# Patient Record
Sex: Female | Born: 1965 | Race: White | Hispanic: No | Marital: Married | Smoking: Never smoker
Health system: Southern US, Community
[De-identification: ages and names within clinical notes are randomized; demographics above are authoritative.]

## PROBLEM LIST (undated history)

## (undated) DIAGNOSIS — T8859XA Other complications of anesthesia, initial encounter: Secondary | ICD-10-CM

## (undated) DIAGNOSIS — R51 Headache: Secondary | ICD-10-CM

## (undated) DIAGNOSIS — K760 Fatty (change of) liver, not elsewhere classified: Secondary | ICD-10-CM

## (undated) DIAGNOSIS — R519 Headache, unspecified: Secondary | ICD-10-CM

## (undated) DIAGNOSIS — M1711 Unilateral primary osteoarthritis, right knee: Secondary | ICD-10-CM

## (undated) DIAGNOSIS — T4145XA Adverse effect of unspecified anesthetic, initial encounter: Secondary | ICD-10-CM

## (undated) DIAGNOSIS — M1990 Unspecified osteoarthritis, unspecified site: Secondary | ICD-10-CM

## (undated) DIAGNOSIS — Z973 Presence of spectacles and contact lenses: Secondary | ICD-10-CM

## (undated) DIAGNOSIS — Z9889 Other specified postprocedural states: Secondary | ICD-10-CM

## (undated) DIAGNOSIS — R112 Nausea with vomiting, unspecified: Secondary | ICD-10-CM

## (undated) DIAGNOSIS — K219 Gastro-esophageal reflux disease without esophagitis: Secondary | ICD-10-CM

## (undated) HISTORY — PX: HERNIA REPAIR: SHX51

## (undated) HISTORY — PX: TUBAL LIGATION: SHX77

## (undated) HISTORY — PX: DILATION AND CURETTAGE OF UTERUS: SHX78

## (undated) HISTORY — PX: UMBILICAL HERNIA REPAIR: SHX196

## (undated) HISTORY — PX: OTHER SURGICAL HISTORY: SHX169

## (undated) HISTORY — PX: ENDOMETRIAL ABLATION: SHX621

## (undated) SURGERY — Surgical Case
Anesthesia: *Unknown

---

## 1985-05-11 HISTORY — PX: KNEE ARTHROSCOPY: SHX127

## 2004-05-12 ENCOUNTER — Emergency Department: Payer: Self-pay | Admitting: General Practice

## 2005-10-01 ENCOUNTER — Emergency Department: Payer: Self-pay | Admitting: General Practice

## 2007-11-23 ENCOUNTER — Ambulatory Visit: Payer: Self-pay | Admitting: Family Medicine

## 2008-03-15 ENCOUNTER — Ambulatory Visit: Payer: Self-pay | Admitting: Obstetrics and Gynecology

## 2008-03-16 ENCOUNTER — Ambulatory Visit: Payer: Self-pay | Admitting: Obstetrics and Gynecology

## 2010-03-21 ENCOUNTER — Ambulatory Visit: Payer: Self-pay | Admitting: Obstetrics and Gynecology

## 2010-03-28 ENCOUNTER — Ambulatory Visit: Payer: Self-pay | Admitting: Obstetrics and Gynecology

## 2010-03-31 LAB — PATHOLOGY REPORT

## 2010-11-06 ENCOUNTER — Ambulatory Visit: Payer: Self-pay | Admitting: Family Medicine

## 2010-11-25 ENCOUNTER — Ambulatory Visit: Payer: Self-pay | Admitting: Internal Medicine

## 2011-03-17 ENCOUNTER — Ambulatory Visit: Payer: Self-pay | Admitting: Obstetrics and Gynecology

## 2011-07-17 ENCOUNTER — Emergency Department: Payer: Self-pay | Admitting: Internal Medicine

## 2011-07-17 LAB — URINALYSIS, COMPLETE
Bacteria: NONE SEEN
Bilirubin,UR: NEGATIVE
Blood: NEGATIVE
Glucose,UR: NEGATIVE mg/dL (ref 0–75)
Ketone: NEGATIVE
Protein: NEGATIVE
RBC,UR: <1 /HPF (ref 0–5)
Specific Gravity: 1.002 (ref 1.003–1.030)
Squamous Epithelial: 1
WBC UR: <1 /HPF (ref 0–5)

## 2011-07-17 LAB — COMPREHENSIVE METABOLIC PANEL
Anion Gap: 7 (ref 7–16)
BUN: 11 mg/dL (ref 7–18)
Calcium, Total: 9.6 mg/dL (ref 8.5–10.1)
Chloride: 105 mmol/L (ref 98–107)
Co2: 29 mmol/L (ref 21–32)
EGFR (African American): >60
EGFR (Non-African Amer.): >60
Glucose: 112 mg/dL — ABNORMAL HIGH (ref 65–99)
Osmolality: 281 (ref 275–301)
Potassium: 3.9 mmol/L (ref 3.5–5.1)
SGOT(AST): 26 U/L (ref 15–37)
SGPT (ALT): 32 U/L
Total Protein: 7.8 g/dL (ref 6.4–8.2)

## 2011-07-17 LAB — TROPONIN I
Troponin-I: <0.02 ng/mL
Troponin-I: <0.02 ng/mL

## 2011-07-17 LAB — CBC
HCT: 37.7 % (ref 35.0–47.0)
MCHC: 33.3 g/dL (ref 32.0–36.0)
MCV: 88 fL (ref 80–100)
RDW: 13.3 % (ref 11.5–14.5)

## 2011-07-17 LAB — CK TOTAL AND CKMB (NOT AT ARMC)
CK, Total: 68 U/L (ref 21–215)
CK-MB: <0.5 ng/mL — ABNORMAL LOW (ref 0.5–3.6)

## 2012-03-30 ENCOUNTER — Ambulatory Visit: Payer: Self-pay | Admitting: Obstetrics and Gynecology

## 2013-04-22 ENCOUNTER — Emergency Department: Payer: Self-pay | Admitting: Emergency Medicine

## 2013-04-25 ENCOUNTER — Ambulatory Visit: Payer: Self-pay | Admitting: Family Medicine

## 2013-04-25 ENCOUNTER — Ambulatory Visit: Payer: Self-pay | Admitting: Obstetrics and Gynecology

## 2013-08-04 ENCOUNTER — Ambulatory Visit: Payer: Self-pay | Admitting: Physician Assistant

## 2013-08-04 LAB — URINALYSIS, COMPLETE
Bacteria: NEGATIVE
Bilirubin,UR: NEGATIVE
Blood: NEGATIVE
Glucose,UR: NEGATIVE mg/dL (ref 0–75)
KETONE: NEGATIVE
LEUKOCYTE ESTERASE: NEGATIVE
Nitrite: NEGATIVE
PH: 6 (ref 4.5–8.0)
PROTEIN: NEGATIVE
SPECIFIC GRAVITY: 1.02 (ref 1.003–1.030)

## 2013-08-04 LAB — CBC WITH DIFFERENTIAL/PLATELET
BASOS PCT: 0.5 %
Basophil #: 0 10*3/uL (ref 0.0–0.1)
EOS ABS: 0.1 10*3/uL (ref 0.0–0.7)
Eosinophil %: 1.7 %
HCT: 37.1 % (ref 35.0–47.0)
HGB: 12.3 g/dL (ref 12.0–16.0)
LYMPHS PCT: 36.7 %
Lymphocyte #: 2.3 10*3/uL (ref 1.0–3.6)
MCH: 29 pg (ref 26.0–34.0)
MCHC: 33.2 g/dL (ref 32.0–36.0)
MCV: 87 fL (ref 80–100)
Monocyte #: 0.6 x10 3/mm (ref 0.2–0.9)
Monocyte %: 9.5 %
Neutrophil #: 3.2 10*3/uL (ref 1.4–6.5)
Neutrophil %: 51.6 %
Platelet: 265 10*3/uL (ref 150–440)
RBC: 4.25 10*6/uL (ref 3.80–5.20)
RDW: 14.2 % (ref 11.5–14.5)
WBC: 6.2 10*3/uL (ref 3.6–11.0)

## 2013-08-04 LAB — BASIC METABOLIC PANEL
Anion Gap: 8 (ref 7–16)
BUN: 20 mg/dL — AB (ref 7–18)
CHLORIDE: 103 mmol/L (ref 98–107)
Calcium, Total: 9.3 mg/dL (ref 8.5–10.1)
Co2: 32 mmol/L (ref 21–32)
Creatinine: 0.85 mg/dL (ref 0.60–1.30)
EGFR (African American): >60
GLUCOSE: 130 mg/dL — AB (ref 65–99)
Osmolality: 289 (ref 275–301)
Potassium: 4.6 mmol/L (ref 3.5–5.1)
Sodium: 143 mmol/L (ref 136–145)

## 2014-05-11 DIAGNOSIS — K5790 Diverticulosis of intestine, part unspecified, without perforation or abscess without bleeding: Secondary | ICD-10-CM

## 2014-05-11 HISTORY — DX: Diverticulosis of intestine, part unspecified, without perforation or abscess without bleeding: K57.90

## 2014-09-25 ENCOUNTER — Other Ambulatory Visit: Payer: Self-pay | Admitting: Obstetrics and Gynecology

## 2014-09-25 DIAGNOSIS — Z1231 Encounter for screening mammogram for malignant neoplasm of breast: Secondary | ICD-10-CM

## 2014-10-03 ENCOUNTER — Ambulatory Visit
Admission: RE | Admit: 2014-10-03 | Discharge: 2014-10-03 | Disposition: A | Discharge status: Home / Self Care | Payer: 59 | Source: Ambulatory Visit | Attending: Obstetrics and Gynecology | Admitting: Obstetrics and Gynecology

## 2014-10-03 DIAGNOSIS — Z1231 Encounter for screening mammogram for malignant neoplasm of breast: Secondary | ICD-10-CM | POA: Insufficient documentation

## 2014-11-14 HISTORY — PX: COLONOSCOPY: SHX174

## 2015-07-09 ENCOUNTER — Other Ambulatory Visit: Payer: Self-pay | Admitting: Orthopedic Surgery

## 2015-07-09 DIAGNOSIS — M25561 Pain in right knee: Secondary | ICD-10-CM

## 2015-07-09 DIAGNOSIS — M2391 Unspecified internal derangement of right knee: Secondary | ICD-10-CM

## 2015-07-12 ENCOUNTER — Ambulatory Visit
Admission: EM | Admit: 2015-07-12 | Discharge: 2015-07-12 | Disposition: A | Discharge status: Home / Self Care | Payer: 59 | Attending: Family Medicine | Admitting: Family Medicine

## 2015-07-12 DIAGNOSIS — H109 Unspecified conjunctivitis: Secondary | ICD-10-CM | POA: Diagnosis not present

## 2015-07-12 MED ORDER — OLOPATADINE HCL 0.1 % OP SOLN
1.0000 [drp] | Freq: Two times a day (BID) | OPHTHALMIC | Status: DC
Start: 2015-07-12 — End: 2016-01-23

## 2015-07-12 MED ORDER — MOXIFLOXACIN HCL 0.5 % OP SOLN: 1.0000 [drp] | Freq: Three times a day (TID) | OPHTHALMIC | Status: DC

## 2015-07-12 NOTE — Discharge Instructions (Signed)
How to Use Eye Drops and Eye Ointments °HOW TO APPLY EYE DROPS °Follow these steps when applying eye drops: °· Wash your hands. °· Tilt your head back. °· Put a finger under your eye and use it to gently pull your lower lid downward. Keep that finger in place. °· Using your other hand, hold the dropper between your thumb and index finger. °· Position the dropper just over the edge of the lower lid. Hold it as close to your eye as you can without touching the dropper to your eye. °· Steady your hand. One way to do this is to lean your index finger against your brow. °· Look up. °· Slowly and gently squeeze one drop of medicine into your eye. °· Close your eye. °· Place a finger between your lower eyelid and your nose. Press gently for 2 minutes. This increases the amount of time that the medicine is exposed to the eye. It also reduces side effects that can develop if the drop gets into the bloodstream through the nose. °HOW TO APPLY EYE OINTMENTS °Follow these steps when applying eye ointments: °· Wash your hands. °· Put a finger under your eye and use it to gently pull your lower lid downward. Keep that finger in place. °· Using your other hand, place the tip of the tube between your thumb and index finger with the remaining fingers braced against your cheek or nose. °· Hold the tube just over the edge of your lower lid without touching the tube to your lid or eyeball. °· Look up. °· Line the inner part of your lower lid with ointment. °· Gently pull up on your upper lid and look down. This will force the ointment to spread over the surface of the eye. °· Release the upper lid. °· If you can, close your eyes for 1-2 minutes. °Do not rub your eyes. If you applied the ointment correctly, your vision will be blurry for a few minutes. This is normal. °ADDITIONAL INFORMATION °· Make sure to use the eye drops or ointment as told by your health care provider. °· If you have been told to use both eye drops and an eye  ointment, apply the eye drops first, then wait 3-4 minutes before you apply the ointment. °· Try not to touch the tip of the dropper or tube to your eye. A dropper or tube that has touched the eye can become contaminated. °  °This information is not intended to replace advice given to you by your health care provider. Make sure you discuss any questions you have with your health care provider. °  °Document Released: 08/03/2000 Document Revised: 09/11/2014 Document Reviewed: 04/23/2014 °Elsevier Interactive Patient Education ©2016 [employer] ° °Bacterial Conjunctivitis °Bacterial conjunctivitis, commonly called pink eye, is an inflammation of the clear membrane that covers the white part of the eye (conjunctiva). The inflammation can also happen on the underside of the eyelids. The blood vessels in the conjunctiva become inflamed, causing the eye to become red or pink. Bacterial conjunctivitis may spread easily from one eye to another and from person to person (contagious).  °CAUSES  °Bacterial conjunctivitis is caused by bacteria. The bacteria may come from your own skin, your upper respiratory tract, or from someone else with bacterial conjunctivitis. °SYMPTOMS  °The normally white color of the eye or the underside of the eyelid is usually pink or red. The pink eye is usually associated with irritation, tearing, and some sensitivity to light. Bacterial conjunctivitis is often   associated with a thick, yellowish discharge from the eye. The discharge may turn into a crust on the eyelids overnight, which causes your eyelids to stick together. If a discharge is present, there may also be some blurred vision in the affected eye. °DIAGNOSIS  °Bacterial conjunctivitis is diagnosed by your caregiver through an eye exam and the symptoms that you report. Your caregiver looks for changes in the surface tissues of your eyes, which may point to the specific type of conjunctivitis. A sample of any discharge may be collected on  a cotton-tip swab if you have a severe case of conjunctivitis, if your cornea is affected, or if you keep getting repeat infections that do not respond to treatment. The sample will be sent to a lab to see if the inflammation is caused by a bacterial infection and to see if the infection will respond to antibiotic medicines. °TREATMENT  °· Bacterial conjunctivitis is treated with antibiotics. Antibiotic eyedrops are most often used. However, antibiotic ointments are also available. Antibiotics pills are sometimes used. Artificial tears or eye washes may ease discomfort. °HOME CARE INSTRUCTIONS  °· To ease discomfort, apply a cool, clean washcloth to your eye for 10-20 minutes, 3-4 times a day. °· Gently wipe away any drainage from your eye with a warm, wet washcloth or a cotton ball. °· Wash your hands often with soap and water. Use paper towels to dry your hands. °· Do not share towels or washcloths. This may spread the infection. °· Change or wash your pillowcase every day. °· You should not use eye makeup until the infection is gone. °· Do not operate machinery or drive if your vision is blurred. °· Stop using contact lenses. Ask your caregiver how to sterilize or replace your contacts before using them again. This depends on the type of contact lenses that you use. °· When applying medicine to the infected eye, do not touch the edge of your eyelid with the eyedrop bottle or ointment tube. °SEEK IMMEDIATE MEDICAL CARE IF:  °· Your infection has not improved within 3 days after beginning treatment. °· You had yellow discharge from your eye and it returns. °· You have increased eye pain. °· Your eye redness is spreading. °· Your vision becomes blurred. °· You have a fever or persistent symptoms for more than 2-3 days. °· You have a fever and your symptoms suddenly get worse. °· You have facial pain, redness, or swelling. °MAKE SURE YOU:  °· Understand these instructions. °· Will watch your condition. °· Will get  help right away if you are not doing well or get worse. °  °This information is not intended to replace advice given to you by your health care provider. Make sure you discuss any questions you have with your health care provider. °  °Document Released: 04/27/2005 Document Revised: 05/18/2014 Document Reviewed: 09/28/2011 °Elsevier Interactive Patient Education ©2016 [employer] ° °

## 2015-07-12 NOTE — ED Notes (Addendum)
Patient c/o left eye pain and was crusted shut this morning.  She states that she has been around people that have pink eye this past Sunday.  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

## 2015-07-12 NOTE — ED Provider Notes (Signed)
CSN: [id number]     Arrival date & time 07/12/15  0900 History   First MD Initiated Contact with Patient 07/12/15 1108     Chief Complaint  Patient presents with  . Conjunctivitis    Left Eye   (Consider location/radiation/quality/duration/timing/severity/associated sxs/prior Treatment) HPI  50 year old female who presents with one-day history of left eye with a watery discharge this morning with a yellow matting of her left eye. He states that she has been around 2 individuals 1 and infant both with confirm bacterial conjunctivitis. His last Sunday 5 days ago. She states that she has no photophobia and had some intermittent form body sensation but that is not present at the present time. She's had no visual disturbances. She does wear contacts but did her period that she will yesterday out and has been using a sub-standard eyeglasses needs a new prescription.  History reviewed. No pertinent past medical history. Past Surgical History  Procedure Laterality Date  . Left knee arthroscopy    . Cesarean section     Family History  Problem Relation Age of Onset  . Cancer Mother    Social History  Substance Use Topics  . Smoking status: Never Smoker   . Smokeless tobacco: Never Used  . Alcohol Use: No   OB History    No data available     Review of Systems  Constitutional: Negative for chills, activity change and fatigue.  Eyes: Positive for pain, discharge and redness. Negative for photophobia and visual disturbance.  All other systems reviewed and are negative.   Allergies  Review of patient's allergies indicates no known allergies.  Home Medications   Prior to Admission medications   Medication Sig Start Date End Date Taking? Authorizing Provider  meloxicam (MOBIC) 7.5 MG tablet Take 7.5 mg by mouth daily.   Yes Historical Provider, MD  moxifloxacin (VIGAMOX) 0.5 % ophthalmic solution Place 1 drop into the left eye 3 (three) times daily. 07/12/15   Lutricia Feil, PA-C   olopatadine (PATANOL) 0.1 % ophthalmic solution Place 1 drop into the left eye 2 (two) times daily. 07/12/15   Lutricia Feil, PA-C   Meds Ordered and Administered this Visit  Medications - No data to display  BP 131/82 mmHg  Pulse 69  Temp(Src) 98 F (36.7 C) (Oral)  Resp 16  Ht  (1.651 m)  Wt 187 lb (84.823 kg)  BMI 31.12 kg/m2  SpO2 100% No data found.   Physical Exam  Constitutional: She appears well-developed and well-nourished. No distress.  HENT:  Head: Normocephalic and atraumatic.  Right Ear: External ear normal.  Left Ear: External ear normal.  Nose: Nose normal.  Mouth/Throat: Oropharynx is clear and moist. No oropharyngeal exudate.  Eyes: EOM are normal. Pupils are equal, round, and reactive to light. Right eye exhibits no discharge. Left eye exhibits discharge.  Examination of the eyes shows PERRLA bilaterally. EOMs are full and intact bilaterally. No irregularity of the pupil is seen. The conjunctiva are mildly injected on the right and more injected on the left although not not intensely erythematous. There is mild yellow discharge seen in the lateral canthus. The eyelid was everted with no findings of any foreign body present. Visual acuity is as is recorded .  Skin: She is not diaphoretic.  Nursing note and vitals reviewed.   ED Course  Procedures (including critical care time)  Labs Review Labs Reviewed - No data to display  Imaging Review No results found.   Visual Acuity  Review  Right Eye Distance: 20/40 (Corrected) Left Eye Distance: 20/30 (Corrected) Bilateral Distance: 20/25 (Corrected)  Right Eye Near:   Left Eye Near:    Bilateral Near:         MDM   1. Conjunctivitis of left eye    Discharge Medication List as of 07/12/2015 11:37 AM    Plan: 1. Diagnosis reviewed with patient 2. rx as per orders; risks, benefits, potential side effects reviewed with patient 3. Recommend supportive treatment with cold compresses as  necessary. She was given a prescription for eyedrops; 1 for mast cell stabilization and the second one for possible  bacterial infection from previous contact with a person with a confirmed conjunctivitis. I've asked her not to wear contacts for the next week she is having treatment. If she is not improved in 2 days I recommended she be seen by an ophthalmologist. 4. F/u prn if symptoms worsen or don't improve     Lutricia FeilWilliam P Lin Hackmann, PA-C 07/12/15 1148

## 2015-07-30 ENCOUNTER — Ambulatory Visit
Admission: RE | Admit: 2015-07-30 | Discharge: 2015-07-30 | Disposition: A | Discharge status: Home / Self Care | Payer: 59 | Source: Ambulatory Visit | Attending: Orthopedic Surgery | Admitting: Orthopedic Surgery

## 2015-07-30 DIAGNOSIS — M25561 Pain in right knee: Secondary | ICD-10-CM | POA: Insufficient documentation

## 2015-07-30 DIAGNOSIS — M2241 Chondromalacia patellae, right knee: Secondary | ICD-10-CM | POA: Insufficient documentation

## 2015-07-30 DIAGNOSIS — M2391 Unspecified internal derangement of right knee: Secondary | ICD-10-CM

## 2015-10-01 ENCOUNTER — Other Ambulatory Visit: Payer: Self-pay | Admitting: Obstetrics and Gynecology

## 2015-10-01 DIAGNOSIS — R748 Abnormal levels of other serum enzymes: Secondary | ICD-10-CM

## 2015-10-01 DIAGNOSIS — M1711 Unilateral primary osteoarthritis, right knee: Secondary | ICD-10-CM | POA: Insufficient documentation

## 2015-10-01 DIAGNOSIS — Z1231 Encounter for screening mammogram for malignant neoplasm of breast: Secondary | ICD-10-CM

## 2015-10-09 ENCOUNTER — Ambulatory Visit: Admission: RE | Admit: 2015-10-09 | Payer: 59 | Source: Ambulatory Visit

## 2015-10-14 ENCOUNTER — Ambulatory Visit: Admission: RE | Admit: 2015-10-14 | Payer: 59 | Source: Ambulatory Visit

## 2015-10-21 ENCOUNTER — Ambulatory Visit
Admission: RE | Admit: 2015-10-21 | Discharge: 2015-10-21 | Disposition: A | Discharge status: Home / Self Care | Payer: 59 | Source: Ambulatory Visit | Attending: Obstetrics and Gynecology | Admitting: Obstetrics and Gynecology

## 2015-10-21 DIAGNOSIS — R928 Other abnormal and inconclusive findings on diagnostic imaging of breast: Secondary | ICD-10-CM | POA: Diagnosis not present

## 2015-10-21 DIAGNOSIS — Z1231 Encounter for screening mammogram for malignant neoplasm of breast: Secondary | ICD-10-CM | POA: Diagnosis not present

## 2015-10-23 ENCOUNTER — Other Ambulatory Visit: Payer: Self-pay | Admitting: Obstetrics and Gynecology

## 2015-10-23 DIAGNOSIS — R928 Other abnormal and inconclusive findings on diagnostic imaging of breast: Secondary | ICD-10-CM

## 2015-10-28 ENCOUNTER — Ambulatory Visit
Admission: RE | Admit: 2015-10-28 | Discharge: 2015-10-28 | Disposition: A | Discharge status: Home / Self Care | Payer: 59 | Source: Ambulatory Visit | Attending: Obstetrics and Gynecology | Admitting: Obstetrics and Gynecology

## 2015-10-28 DIAGNOSIS — R928 Other abnormal and inconclusive findings on diagnostic imaging of breast: Secondary | ICD-10-CM

## 2015-11-14 ENCOUNTER — Ambulatory Visit: Payer: Self-pay | Admitting: Family Medicine

## 2015-11-27 ENCOUNTER — Ambulatory Visit: Payer: Self-pay | Admitting: Family Medicine

## 2015-11-28 ENCOUNTER — Ambulatory Visit: Payer: Self-pay | Admitting: Family Medicine

## 2015-11-29 ENCOUNTER — Ambulatory Visit: Payer: Self-pay | Admitting: Family Medicine

## 2015-12-11 ENCOUNTER — Ambulatory Visit (INDEPENDENT_AMBULATORY_CARE_PROVIDER_SITE_OTHER): Payer: 59 | Admitting: Family Medicine

## 2015-12-11 ENCOUNTER — Encounter: Payer: Self-pay | Admitting: Family Medicine

## 2015-12-11 VITALS — BP 100/70 | HR 70 | Ht 65.0 in | Wt 193.0 lb

## 2015-12-11 DIAGNOSIS — R945 Abnormal results of liver function studies: Principal | ICD-10-CM

## 2015-12-11 DIAGNOSIS — R7989 Other specified abnormal findings of blood chemistry: Secondary | ICD-10-CM | POA: Diagnosis not present

## 2015-12-11 NOTE — Progress Notes (Signed)
Name: Tamara [name]   MRN: [id number]    DOB: 1965-11-01   Date:12/11/2015       Progress Note  Subjective  Chief Complaint  Chief Complaint  Patient presents with  . Follow-up    elevated ALT enzyme on labs from 10/18/15    Patient presents with mild elevation of liver function test.    No problem-specific Assessment & Plan notes found for this encounter.   No past medical history on file.  Past Surgical History:  Procedure Laterality Date  . CESAREAN SECTION    . Left Knee Arthroscopy      Family History  Problem Relation Age of Onset  . Cancer Mother   . Breast cancer Neg Hx     Social History   Social History  . Marital status: Married    Spouse name: N/A  . Number of children: N/A  . Years of education: N/A   Occupational History  . Not on file.   Social History Main Topics  . Smoking status: Never Smoker  . Smokeless tobacco: Never Used  . Alcohol use No  . Drug use: No  . Sexual activity: Not on file   Other Topics Concern  . Not on file   Social History Narrative  . No narrative on file    No Known Allergies   Review of Systems  Constitutional: Negative for chills, fever, malaise/fatigue and weight loss.  HENT: Negative for ear discharge, ear pain and sore throat.   Eyes: Negative for blurred vision.  Respiratory: Negative for cough, sputum production, shortness of breath and wheezing.   Cardiovascular: Negative for chest pain, palpitations and leg swelling.  Gastrointestinal: Negative for abdominal pain, blood in stool, constipation, diarrhea, heartburn, melena and nausea.  Genitourinary: Negative for dysuria, frequency, hematuria and urgency.  Musculoskeletal: Negative for back pain, joint pain, myalgias and neck pain.  Skin: Negative for rash.  Neurological: Negative for dizziness, tingling, sensory change, focal weakness and headaches.  Endo/Heme/Allergies: Negative for environmental allergies and polydipsia. Does not bruise/bleed easily.   Psychiatric/Behavioral: Negative for depression and suicidal ideas. The patient is not nervous/anxious and does not have insomnia.      Objective  Vitals:   12/11/15 1132  BP: 100/70  Pulse: 70  Weight: 193 lb (87.5 kg)  Height: 5\' 5"  (1.651 m)    Physical Exam  Constitutional: She is well-developed, well-nourished, and in no distress. No distress.  HENT:  Head: Normocephalic and atraumatic.  Right Ear: External ear normal.  Left Ear: External ear normal.  Nose: Nose normal.  Mouth/Throat: Oropharynx is clear and moist.  Eyes: Conjunctivae and EOM are normal. Pupils are equal, round, and reactive to light. Right eye exhibits no discharge. Left eye exhibits no discharge.  Neck: Normal range of motion. Neck supple. No JVD present. No thyromegaly present.  Cardiovascular: Normal rate, regular rhythm, normal heart sounds and intact distal pulses.  Exam reveals no gallop and no friction rub.   No murmur heard. Pulmonary/Chest: Effort normal and breath sounds normal. She has no wheezes. She has no rales.  Abdominal: Soft. Bowel sounds are normal. She exhibits no mass. There is no hepatosplenomegaly. There is no tenderness. There is no guarding and no CVA tenderness.  Musculoskeletal: Normal range of motion. She exhibits no edema.  Lymphadenopathy:    She has no cervical adenopathy.  Neurological: She is alert. She has normal reflexes.  Skin: Skin is warm and dry. She is not diaphoretic.  Psychiatric: Mood and affect normal.  Nursing note and vitals reviewed.     Assessment & Plan  Problem List Items Addressed This Visit    None    Visit Diagnoses    Elevated liver function tests    -  Primary   Relevant Orders   Hepatic function panel        Dr. Elizabeth Sauer [facility]  12/11/15

## 2015-12-12 ENCOUNTER — Other Ambulatory Visit: Payer: Self-pay

## 2015-12-12 LAB — HEPATIC FUNCTION PANEL
ALBUMIN: 4.3 g/dL (ref 3.5–5.5)
ALK PHOS: 74 IU/L (ref 39–117)
ALT: 75 IU/L — ABNORMAL HIGH (ref 0–32)
AST: 41 IU/L — AB (ref 0–40)
BILIRUBIN TOTAL: 0.2 mg/dL (ref 0.0–1.2)
Bilirubin, Direct: 0.08 mg/dL (ref 0.00–0.40)
Total Protein: 7.3 g/dL (ref 6.0–8.5)

## 2015-12-17 ENCOUNTER — Ambulatory Visit
Admission: RE | Admit: 2015-12-17 | Discharge: 2015-12-17 | Disposition: A | Discharge status: Home / Self Care | Payer: 59 | Source: Ambulatory Visit | Attending: Family Medicine | Admitting: Family Medicine

## 2015-12-17 ENCOUNTER — Other Ambulatory Visit: Payer: Self-pay

## 2015-12-17 DIAGNOSIS — R748 Abnormal levels of other serum enzymes: Secondary | ICD-10-CM | POA: Diagnosis present

## 2015-12-17 DIAGNOSIS — R932 Abnormal findings on diagnostic imaging of liver and biliary tract: Secondary | ICD-10-CM | POA: Insufficient documentation

## 2015-12-17 NOTE — Patient Instructions (Addendum)

## 2015-12-18 ENCOUNTER — Ambulatory Visit: Payer: 59

## 2016-01-08 ENCOUNTER — Other Ambulatory Visit: Payer: Self-pay

## 2016-01-21 ENCOUNTER — Ambulatory Visit: Payer: 59 | Admitting: Family Medicine

## 2016-01-23 ENCOUNTER — Ambulatory Visit (INDEPENDENT_AMBULATORY_CARE_PROVIDER_SITE_OTHER): Payer: 59 | Admitting: Family Medicine

## 2016-01-23 ENCOUNTER — Encounter: Payer: Self-pay | Admitting: Family Medicine

## 2016-01-23 VITALS — BP 120/88 | HR 80 | Ht 65.0 in | Wt 196.0 lb

## 2016-01-23 DIAGNOSIS — R899 Unspecified abnormal finding in specimens from other organs, systems and tissues: Secondary | ICD-10-CM | POA: Diagnosis not present

## 2016-01-23 DIAGNOSIS — R635 Abnormal weight gain: Secondary | ICD-10-CM

## 2016-01-23 DIAGNOSIS — E8889 Other specified metabolic disorders: Secondary | ICD-10-CM

## 2016-01-23 DIAGNOSIS — E669 Obesity, unspecified: Secondary | ICD-10-CM

## 2016-01-23 DIAGNOSIS — E7889 Other lipoprotein metabolism disorders: Secondary | ICD-10-CM | POA: Diagnosis not present

## 2016-01-23 NOTE — Progress Notes (Signed)
Name: Tamara [name]   MRN: [id number]    DOB: 07/22/1965   Date:01/23/2016       Progress Note  Subjective  Chief Complaint  Chief Complaint  Patient presents with  . Follow-up    lab/ liver enzymes    PATIENT PRESENTS FOR FOLLOWUP FOR ELEVATED LIVER ENYMES.    Other  This is a recurrent (elevation of LFT's.) problem. The current episode started more than 1 month ago. The problem occurs intermittently. The problem has been waxing and waning. Pertinent negatives include no abdominal pain, anorexia, arthralgias, change in bowel habit, chest pain, chills, congestion, coughing, diaphoresis, fatigue, fever, headaches, joint swelling, myalgias, nausea, neck pain, numbness, rash, sore throat, swollen glands, urinary symptoms, vertigo, visual change, vomiting or weakness. Nothing aggravates the symptoms. Treatments tried: low lipid diet. The treatment provided mild relief.    No problem-specific Assessment & Plan notes found for this encounter.   History reviewed. No pertinent past medical history.  Past Surgical History:  Procedure Laterality Date  . CESAREAN SECTION    . Left Knee Arthroscopy      Family History  Problem Relation Age of Onset  . Cancer Mother   . Breast cancer Neg Hx     Social History   Social History  . Marital status: Married    Spouse name: N/A  . Number of children: N/A  . Years of education: N/A   Occupational History  . Not on file.   Social History Main Topics  . Smoking status: Never Smoker  . Smokeless tobacco: Never Used  . Alcohol use No  . Drug use: No  . Sexual activity: Not on file   Other Topics Concern  . Not on file   Social History Narrative  . No narrative on file    No Known Allergies   Review of Systems  Constitutional: Negative for chills, diaphoresis, fatigue, fever, malaise/fatigue and weight loss.  HENT: Negative for congestion, ear discharge, ear pain and sore throat.   Eyes: Negative for blurred vision.   Respiratory: Negative for cough, sputum production, shortness of breath and wheezing.   Cardiovascular: Negative for chest pain, palpitations and leg swelling.  Gastrointestinal: Negative for abdominal pain, anorexia, blood in stool, change in bowel habit, constipation, diarrhea, heartburn, melena, nausea and vomiting.  Genitourinary: Negative for dysuria, frequency, hematuria and urgency.  Musculoskeletal: Negative for arthralgias, back pain, joint pain, joint swelling, myalgias and neck pain.  Skin: Negative for rash.  Neurological: Negative for dizziness, vertigo, tingling, sensory change, focal weakness, weakness, numbness and headaches.  Endo/Heme/Allergies: Negative for environmental allergies and polydipsia. Does not bruise/bleed easily.  Psychiatric/Behavioral: Negative for depression and suicidal ideas. The patient is not nervous/anxious and does not have insomnia.      Objective  Vitals:   01/23/16 0922  BP: 120/88  Pulse: 80  Weight: 196 lb (88.9 kg)  Height: 5\' 5"  (1.651 m)    Physical Exam    Assessment & Plan  Problem List Items Addressed This Visit    None    Visit Diagnoses    Abnormal laboratory test    -  Primary   Relevant Orders   Hepatic function panel   Steatosis       Relevant Orders   Hepatic function panel   Obesity       suggest weight loss   Relevant Orders   Thyroid Panel With TSH   Weight gain, abnormal       Relevant Orders   Thyroid  Panel With TSH        Dr. [facility]reanna Lev[facility]  01/23/16

## 2016-01-23 NOTE — Patient Instructions (Signed)
Why follow it? Research shows. . Those who follow the Mediterranean diet have a reduced risk of heart disease  . The diet is associated with a reduced incidence of Parkinson's and Alzheimer's diseases . People following the diet may have longer life expectancies and lower rates of chronic diseases  . The Dietary Guidelines for Americans recommends the Mediterranean diet as an eating plan to promote health and prevent disease  What Is the Mediterranean Diet?  . Healthy eating plan based on typical foods and recipes of Mediterranean-style cooking . The diet is primarily a plant based diet; these foods should make up a majority of meals   Starches - Plant based foods should make up a majority of meals - They are an important sources of vitamins, minerals, energy, antioxidants, and fiber - Choose whole grains, foods high in fiber and minimally processed items  - Typical grain sources include wheat, oats, barley, corn, brown rice, bulgar, farro, millet, polenta, couscous  - Various types of beans include chickpeas, lentils, fava beans, black beans, white beans   Fruits  Veggies - Large quantities of antioxidant rich fruits & veggies; 6 or more servings  - Vegetables can be eaten raw or lightly drizzled with oil and cooked  - Vegetables common to the traditional Mediterranean Diet include: artichokes, arugula, beets, broccoli, brussel sprouts, cabbage, carrots, celery, collard greens, cucumbers, eggplant, kale, leeks, lemons, lettuce, mushrooms, okra, onions, peas, peppers, potatoes, pumpkin, radishes, rutabaga, shallots, spinach, sweet potatoes, turnips, zucchini - Fruits common to the Mediterranean Diet include: apples, apricots, avocados, cherries, clementines, dates, figs, grapefruits, grapes, melons, nectarines, oranges, peaches, pears, pomegranates, strawberries, tangerines  Fats - Replace butter and margarine with healthy oils, such as olive oil, canola oil, and tahini  - Limit nuts to no  more than a handful a day  - Nuts include walnuts, almonds, pecans, pistachios, pine nuts  - Limit or avoid candied, honey roasted or heavily salted nuts - Olives are central to the Mediterranean diet - can be eaten whole or used in a variety of dishes   Meats Protein - Limiting red meat: no more than a few times a month - When eating red meat: choose lean cuts and keep the portion to the size of deck of cards - Eggs: approx. 0 to 4 times a week  - Fish and lean poultry: at least 2 a week  - Healthy protein sources include, chicken, turkey, lean beef, lamb - Increase intake of seafood such as tuna, salmon, trout, mackerel, shrimp, scallops - Avoid or limit high fat processed meats such as sausage and bacon  Dairy - Include moderate amounts of low fat dairy products  - Focus on healthy dairy such as fat free yogurt, skim milk, low or reduced fat cheese - Limit dairy products higher in fat such as whole or 2% milk, cheese, ice cream  Alcohol - Moderate amounts of red wine is ok  - No more than 5 oz daily for women (all ages) and men older than age 65  - No more than 10 oz of wine daily for men younger than 65  Other - Limit sweets and other desserts  - Use herbs and spices instead of salt to flavor foods  - Herbs and spices common to the traditional Mediterranean Diet include: basil, bay leaves, chives, cloves, cumin, fennel, garlic, lavender, marjoram, mint, oregano, parsley, pepper, rosemary, sage, savory, sumac, tarragon, thyme   It's not just a diet, it's a lifestyle:  . The Mediterranean diet includes   lifestyle factors typical of those in the region  . Foods, drinks and meals are best eaten with others and savored . Daily physical activity is important for overall good health . This could be strenuous exercise like running and aerobics . This could also be more leisurely activities such as walking, housework, yard-work, or taking the stairs . Moderation is the key; a balanced and  healthy diet accommodates most foods and drinks . Consider portion sizes and frequency of consumption of certain foods   Meal Ideas & Options:  . Breakfast:  o Whole wheat toast or whole wheat English muffins with peanut butter & hard boiled egg o Steel cut oats topped with apples & cinnamon and skim milk  o Fresh fruit: banana, strawberries, melon, berries, peaches  o Smoothies: strawberries, bananas, greek yogurt, peanut butter o Low fat greek yogurt with blueberries and granola  o Egg white omelet with spinach and mushrooms o Breakfast couscous: whole wheat couscous, apricots, skim milk, cranberries  . Sandwiches:  o Hummus and grilled vegetables (peppers, zucchini, squash) on whole wheat bread   o Grilled chicken on whole wheat pita with lettuce, tomatoes, cucumbers or tzatziki  o Tuna salad on whole wheat bread: tuna salad made with greek yogurt, olives, red peppers, capers, green onions o Garlic rosemary lamb pita: lamb sauted with garlic, rosemary, salt & pepper; add lettuce, cucumber, greek yogurt to pita - flavor with lemon juice and black pepper  . Seafood:  o Mediterranean grilled salmon, seasoned with garlic, basil, parsley, lemon juice and black pepper o Shrimp, lemon, and spinach whole-grain pasta salad made with low fat greek yogurt  o Seared scallops with lemon orzo  o Seared tuna steaks seasoned salt, pepper, coriander topped with tomato mixture of olives, tomatoes, olive oil, minced garlic, parsley, green onions and cappers  . Meats:  o Herbed greek chicken salad with kalamata olives, cucumber, feta  o Red bell peppers stuffed with spinach, bulgur, lean ground beef (or lentils) & topped with feta   o Kebabs: skewers of chicken, tomatoes, onions, zucchini, squash  o [place] burgers: made with red onions, mint, dill, lemon juice, feta cheese topped with roasted red peppers . Vegetarian o Cucumber salad: cucumbers, artichoke hearts, celery, red onion, feta cheese, tossed in  olive oil & lemon juice  o Hummus and whole grain pita points with a greek salad (lettuce, tomato, feta, olives, cucumbers, red onion) o Lentil soup with celery, carrots made with vegetable broth, garlic, salt and pepper  o Tabouli salad: parsley, bulgur, mint, scallions, cucumbers, tomato, radishes, lemon juice, olive oil, salt and pepper.     Calorie Counting for Weight Loss Calories are energy you get from the things you eat and drink. Your body uses this energy to keep you going throughout the day. The number of calories you eat affects your weight. When you eat more calories than your body needs, your body stores the extra calories as fat. When you eat fewer calories than your body needs, your body burns fat to get the energy it needs. Calorie counting means keeping track of how many calories you eat and drink each day. If you make sure to eat fewer calories than your body needs, you should lose weight. In order for calorie counting to work, you will need to eat the number of calories that are right for you in a day to lose a healthy amount of weight per week. A healthy amount of weight to lose per week is usually 1-2  lb (0.5-0.9 kg). A dietitian can determine how many calories you need in a day and give you suggestions on how to reach your calorie goal.  WHAT IS MY MY PLAN? My goal is to have __________ calories per day.  If I have this many calories per day, I should lose around __________ pounds per week. WHAT DO I NEED TO KNOW ABOUT CALORIE COUNTING? In order to meet your daily calorie goal, you will need to:  Find out how many calories are in each food you would like to eat. Try to do this before you eat.  Decide how much of the food you can eat.  Write down what you ate and how many calories it had. Doing this is called keeping a food log. WHERE DO I FIND CALORIE INFORMATION? The number of calories in a food can be found on a Nutrition Facts label. Note that all the information on a  label is based on a specific serving of the food. If a food does not have a Nutrition Facts label, try to look up the calories online or ask your dietitian for help. HOW DO I DECIDE HOW MUCH TO EAT? To decide how much of the food you can eat, you will need to consider both the number of calories in one serving and the size of one serving. This information can be found on the Nutrition Facts label. If a food does not have a Nutrition Facts label, look up the information online or ask your dietitian for help. Remember that calories are listed per serving. If you choose to have more than one serving of a food, you will have to multiply the calories per serving by the amount of servings you plan to eat. For example, the label on a package of bread might say that a serving size is 1 slice and that there are 90 calories in a serving. If you eat 1 slice, you will have eaten 90 calories. If you eat 2 slices, you will have eaten 180 calories. HOW DO I KEEP A FOOD LOG? After each meal, record the following information in your food log:  What you ate.  How much of it you ate.  How many calories it had.  Then, add up your calories. Keep your food log near you, such as in a small notebook in your pocket. Another option is to use a mobile app or website. Some programs will calculate calories for you and show you how many calories you have left each time you add an item to the log. WHAT ARE SOME CALORIE COUNTING TIPS?  Use your calories on foods and drinks that will fill you up and not leave you hungry. Some examples of this include foods like nuts and nut butters, vegetables, lean proteins, and high-fiber foods (more than 5 g fiber per serving).  Eat nutritious foods and avoid empty calories. Empty calories are calories you get from foods or beverages that do not have many nutrients, such as candy and soda. It is better to have a nutritious high-calorie food (such as an avocado) than a food with few nutrients  (such as a bag of chips).  Know how many calories are in the foods you eat most often. This way, you do not have to look up how many calories they have each time you eat them.  Look out for foods that may seem like low-calorie foods but are really high-calorie foods, such as baked goods, soda, and fat-free candy.  Pay attention  to calories in drinks. Drinks such as sodas, specialty coffee drinks, alcohol, and juices have a lot of calories yet do not fill you up. Choose low-calorie drinks like water and diet drinks.  Focus your calorie counting efforts on higher calorie items. Logging the calories in a garden salad that contains only vegetables is less important than calculating the calories in a milk shake.  Find a way of tracking calories that works for you. Get creative. Most people who are successful find ways to keep track of how much they eat in a day, even if they do not count every calorie. WHAT ARE SOME PORTION CONTROL TIPS?  Know how many calories are in a serving. This will help you know how many servings of a certain food you can have.  Use a measuring cup to measure serving sizes. This is helpful when you start out. With time, you will be able to estimate serving sizes for some foods.  Take some time to put servings of different foods on your favorite plates, bowls, and cups so you know what a serving looks like.  Try not to eat straight from a bag or box. Doing this can lead to overeating. Put the amount you would like to eat in a cup or on a plate to make sure you are eating the right portion.  Use smaller plates, glasses, and bowls to prevent overeating. This is a quick and easy way to practice portion control. If your plate is smaller, less food can fit on it.  Try not to multitask while eating, such as watching TV or using your computer. If it is time to eat, sit down at a table and enjoy your food. Doing this will help you to start recognizing when you are full. It will also  make you more aware of what and how much you are eating. HOW CAN I CALORIE COUNT WHEN EATING OUT?  Ask for smaller portion sizes or child-sized portions.  Consider sharing an entree and sides instead of getting your own entree.  If you get your own entree, eat only half. Ask for a box at the beginning of your meal and put the rest of your entree in it so you are not tempted to eat it.  Look for the calories on the menu. If calories are listed, choose the lower calorie options.  Choose dishes that include vegetables, fruits, whole grains, low-fat dairy products, and lean protein. Focusing on smart food choices from each of the 5 food groups can help you stay on track at restaurants.  Choose items that are boiled, broiled, grilled, or steamed.  Choose water, milk, unsweetened iced tea, or other drinks without added sugars. If you want an alcoholic beverage, choose a lower calorie option. For example, a regular margarita can have up to 700 calories and a glass of wine has around 150.  Stay away from items that are buttered, battered, fried, or served with cream sauce. Items labeled "crispy" are usually fried, unless stated otherwise.  Ask for dressings, sauces, and syrups on the side. These are usually very high in calories, so do not eat much of them.  Watch out for salads. Many people think salads are a healthy option, but this is often not the case. Many salads come with bacon, fried chicken, lots of cheese, fried chips, and dressing. All of these items have a lot of calories. If you want a salad, choose a garden salad and ask for grilled meats or steak. Ask for  the dressing on the side, or ask for olive oil and vinegar or lemon to use as dressing.  Estimate how many servings of a food you are given. For example, a serving of cooked rice is  cup or about the size of half a tennis ball or one cupcake wrapper. Knowing serving sizes will help you be aware of how much food you are eating at  restaurants. The list below tells you how big or small some common portion sizes are based on everyday objects.  1 oz--4 stacked dice.  3 oz--1 deck of cards.  1 tsp--1 dice.  1 Tbsp-- a Ping-Pong ball.  2 Tbsp--1 Ping-Pong ball.   cup--1 tennis ball or 1 cupcake wrapper.  1 cup--1 baseball.   This information is not intended to replace advice given to you by your health care provider. Make sure you discuss any questions you have with your health care provider.   Document Released: 04/27/2005 Document Revised: 05/18/2014 Document Reviewed: 03/02/2013 Elsevier Interactive Patient Education [employer]2016 [employer]

## 2016-01-24 ENCOUNTER — Other Ambulatory Visit: Payer: Self-pay

## 2016-01-24 DIAGNOSIS — R748 Abnormal levels of other serum enzymes: Secondary | ICD-10-CM

## 2016-01-24 DIAGNOSIS — K760 Fatty (change of) liver, not elsewhere classified: Secondary | ICD-10-CM

## 2016-01-24 LAB — THYROID PANEL WITH TSH
Free Thyroxine Index: 2.1 (ref 1.2–4.9)
T3 UPTAKE RATIO: 26 % (ref 24–39)
T4, Total: 8.2 ug/dL (ref 4.5–12.0)
TSH: 1.06 u[IU]/mL (ref 0.450–4.500)

## 2016-01-24 LAB — HEPATIC FUNCTION PANEL
ALT: 83 IU/L — AB (ref 0–32)
AST: 52 IU/L — ABNORMAL HIGH (ref 0–40)
Albumin: 4.4 g/dL (ref 3.5–5.5)
Alkaline Phosphatase: 74 IU/L (ref 39–117)
BILIRUBIN TOTAL: 0.5 mg/dL (ref 0.0–1.2)
Bilirubin, Direct: 0.13 mg/dL (ref 0.00–0.40)
TOTAL PROTEIN: 7.5 g/dL (ref 6.0–8.5)

## 2016-02-05 ENCOUNTER — Other Ambulatory Visit: Payer: Self-pay

## 2016-02-13 ENCOUNTER — Ambulatory Visit (INDEPENDENT_AMBULATORY_CARE_PROVIDER_SITE_OTHER): Payer: 59

## 2016-02-13 DIAGNOSIS — Z23 Encounter for immunization: Secondary | ICD-10-CM | POA: Diagnosis not present

## 2016-02-14 ENCOUNTER — Ambulatory Visit: Payer: 59

## 2016-02-19 ENCOUNTER — Other Ambulatory Visit: Payer: Self-pay

## 2016-02-19 ENCOUNTER — Ambulatory Visit (INDEPENDENT_AMBULATORY_CARE_PROVIDER_SITE_OTHER): Payer: 59 | Admitting: Gastroenterology

## 2016-02-19 ENCOUNTER — Encounter: Payer: Self-pay | Admitting: Gastroenterology

## 2016-02-19 VITALS — BP 141/71 | HR 76 | Temp 98.1°F | Ht 65.0 in | Wt 195.0 lb

## 2016-02-19 DIAGNOSIS — K760 Fatty (change of) liver, not elsewhere classified: Secondary | ICD-10-CM | POA: Diagnosis not present

## 2016-02-19 DIAGNOSIS — R748 Abnormal levels of other serum enzymes: Secondary | ICD-10-CM

## 2016-02-19 NOTE — Patient Instructions (Signed)
You have been given an order for labs today. Please go to any [facility] draw station. We will contact you once these labs have returned.

## 2016-02-19 NOTE — Progress Notes (Signed)
Gastroenterology Consultation  Referring Provider:     Juline Patch, MD Primary Care Physician:  Otilio Miu, MD Primary Gastroenterologist:  Dr. Allen Norris     Reason for Consultation:     Abnormal liver enzymes        HPI:   Tamara Tamara [name] is a 50 y.o. y/o Tamara [name] referred for consultation & management of Abnormal liver enzymes by Dr. Otilio Miu, MD.  This patient comes today with a report of abnormal liver enzymes. The patient states that she has had recent lab work that showed it to go up even higher than previously. The patient's ultrasound showed her to have fatty liver. The patient does have a sister who has had cirrhosis presumably from hepatitis C. The patient also has a family history of colon polyps. The patient had a colonoscopy approximately a year ago by Dr. Eliott Nine. The patient states that colonoscopy was set up by her gynecologist and was normal. The patient also reports that she has some right upper quadrant pain. It is not made better or worse with eating or drinking. The patient denies any black stools or bloody stools. She also denies any nausea or vomiting. There is blood work from 2013 that showed her liver enzymes normal with August showing AST of 41 with ALT of 75 and the most recent labs in September were AST of 52 with ALT of 83. The patient's alk phosphatase and bilirubin have been normal.  History reviewed. No pertinent past medical history.  Past Surgical History:  Procedure Laterality Date  . CESAREAN SECTION    . Left Knee Arthroscopy      Prior to Admission medications   Medication Sig Start Date End Date Taking? Authorizing Provider  ibuprofen (ADVIL,MOTRIN) 800 MG tablet Take by mouth.    Historical Provider, MD  meloxicam (MOBIC) 7.5 MG tablet Take by mouth.    Historical Provider, MD  traMADol Veatrice Bourbon) 50 MG tablet Take by mouth. 07/02/15   Historical Provider, MD    Family History  Problem Relation Age of Onset  . Cancer Mother   . Breast cancer Neg Hx       Social History  Substance Use Topics  . Smoking status: Never Smoker  . Smokeless tobacco: Never Used  . Alcohol use No    Allergies as of 02/19/2016  . (No Known Allergies)    Review of Systems:    All systems reviewed and negative except where noted in HPI.   Physical Exam:  BP (!) 141/71   Pulse 76   Temp 98.1 F (36.7 C) (Oral)   Ht 5' 5"  (1.651 m)   Wt 195 lb (88.5 kg)   BMI 32.45 kg/m  No LMP recorded. Patient has had an ablation. Psych:  Alert and cooperative. Normal mood and affect. General:   Alert,  Well-developed, well-nourished, pleasant and cooperative in NAD Head:  Normocephalic and atraumatic. Eyes:  Sclera clear, no icterus.   Conjunctiva pink. Ears:  Normal auditory acuity. Nose:  No deformity, discharge, or lesions. Mouth:  No deformity or lesions,oropharynx pink & moist. Neck:  Supple; no masses or thyromegaly. Lungs:  Respirations even and unlabored.  Clear throughout to auscultation.   No wheezes, crackles, or rhonchi. No acute distress. Heart:  Regular rate and rhythm; no murmurs, clicks, rubs, or gallops. Abdomen:  Normal bowel sounds.  No bruits.  Soft, mild right sided tenderness and non-distended without masses, hepatosplenomegaly or hernias noted.  No guarding or rebound tenderness.  Negative Carnett  sign.   Rectal:  Deferred.  Msk:  Symmetrical without gross deformities.  Good, equal movement & strength bilaterally. Pulses:  Normal pulses noted. Extremities:  No clubbing or edema.  No cyanosis. Neurologic:  Alert and oriented x3;  grossly normal neurologically. Skin:  Intact without significant lesions or rashes.  No jaundice. Lymph Nodes:  No significant cervical adenopathy. Psych:  Alert and cooperative. Normal mood and affect.  Imaging Studies: No results found.  Assessment and Plan:   Tamara Tamara [name] is a 50 y.o. y/o Tamara [name] who comes here with a finding of abnormal liver enzymes. The patient's AST and ALT have been elevated. The  patient denies alcohol abuse or a history of abnormal liver enzymes before August of this year. The patient will have her labs sent off for possible causes of her abnormal liver enzymes. The patient had an ultrasound that showed fatty liver which may be the cause of this patient's abnormal liver enzymes. The patient will be contacted with the results of her labs and she has also been told to try and lose weight to neck 3 months and have her labs checked again. The right-sided abdominal pain may be from the fatty liver and no further investigation will be undertaken at this time until liver causes of her abdominal pain and ruled out. Patient has been explained the plan and agrees with it.   Note: This dictation was prepared with Dragon dictation along with smaller phrase technology. Any transcriptional errors that result from this process are unintentional.

## 2016-02-20 LAB — HEPATITIS A ANTIBODY, TOTAL: Hep A Total Ab: NEGATIVE

## 2016-02-20 LAB — IGG, IGA, IGM
IgA/Immunoglobulin A, Serum: 275 mg/dL (ref 87–352)
IgG (Immunoglobin G), Serum: 1133 mg/dL (ref 700–1600)
IgM (Immunoglobulin M), Srm: 288 mg/dL — ABNORMAL HIGH (ref 26–217)

## 2016-02-20 LAB — IRON AND TIBC
IRON SATURATION: 31 % (ref 15–55)
IRON: 99 ug/dL (ref 27–159)
TIBC: 322 ug/dL (ref 250–450)
UIBC: 223 ug/dL (ref 131–425)

## 2016-02-20 LAB — HEPATITIS B SURFACE ANTIGEN: HEP B S AG: NEGATIVE

## 2016-02-20 LAB — ANTI-SMOOTH MUSCLE ANTIBODY, IGG: Smooth Muscle Ab: 20 Units — ABNORMAL HIGH (ref 0–19)

## 2016-02-20 LAB — CERULOPLASMIN: CERULOPLASMIN: 41.5 mg/dL — AB (ref 19.0–39.0)

## 2016-02-20 LAB — ALPHA-1-ANTITRYPSIN: A1 ANTITRYPSIN: 132 mg/dL (ref 90–200)

## 2016-02-20 LAB — HEPATITIS C ANTIBODY: Hep C Virus Ab: <0.1 s/co ratio (ref 0.0–0.9)

## 2016-02-20 LAB — HEPATITIS B SURFACE ANTIBODY,QUALITATIVE: HEP B SURFACE AB, QUAL: REACTIVE

## 2016-02-20 LAB — MITOCHONDRIAL ANTIBODIES: MITOCHONDRIAL AB: 6.3 U (ref 0.0–20.0)

## 2016-02-20 LAB — FERRITIN: Ferritin: 189 ng/mL — ABNORMAL HIGH (ref 15–150)

## 2016-02-24 ENCOUNTER — Telehealth: Payer: Self-pay

## 2016-02-24 NOTE — Telephone Encounter (Signed)
Patient stated that she called on Friday for her to get her lab results. She wants to know what they are no later than today. Can you please give her a call when you have a chance. Thank you.

## 2016-02-24 NOTE — Telephone Encounter (Signed)
Please review labs and advise.

## 2016-02-25 ENCOUNTER — Telehealth: Payer: Self-pay

## 2016-02-25 NOTE — Telephone Encounter (Signed)
Patient is returning your phone call pertaining to her test results. Please give the patient a call back

## 2016-02-25 NOTE — Telephone Encounter (Signed)
-----   Message from Midge Miniumarren Wohl, MD sent at 02/24/2016  6:28 PM EDT ----- Let the patient know that the labs did not show any other cause other then the fatty liver.

## 2016-02-25 NOTE — Telephone Encounter (Signed)
-----   Message from Darren Wohl, MD sent at 02/24/2016  6:28 PM EDT ----- Let the patient know that the labs did not show any other cause other then the fatty liver. 

## 2016-02-25 NOTE — Telephone Encounter (Signed)
LVM for pt to return my call.

## 2016-02-25 NOTE — Telephone Encounter (Signed)
Pt notified of lab results

## 2016-07-23 ENCOUNTER — Ambulatory Visit
Admission: EM | Admit: 2016-07-23 | Discharge: 2016-07-23 | Disposition: A | Discharge status: Home / Self Care | Payer: 59 | Attending: Family Medicine | Admitting: Family Medicine

## 2016-07-23 ENCOUNTER — Encounter: Payer: Self-pay | Admitting: Emergency Medicine

## 2016-07-23 DIAGNOSIS — R69 Illness, unspecified: Secondary | ICD-10-CM | POA: Diagnosis not present

## 2016-07-23 DIAGNOSIS — R6889 Other general symptoms and signs: Secondary | ICD-10-CM

## 2016-07-23 DIAGNOSIS — J029 Acute pharyngitis, unspecified: Secondary | ICD-10-CM | POA: Diagnosis not present

## 2016-07-23 DIAGNOSIS — J111 Influenza due to unidentified influenza virus with other respiratory manifestations: Secondary | ICD-10-CM

## 2016-07-23 LAB — RAPID STREP SCREEN (MED CTR MEBANE ONLY): Streptococcus, Group A Screen (Direct): NEGATIVE

## 2016-07-23 MED ORDER — FEXOFENADINE-PSEUDOEPHED ER 180-240 MG PO TB24: 1.0000 | ORAL_TABLET | Freq: Every day | ORAL | 0 refills | Status: DC

## 2016-07-23 MED ORDER — HYDROCOD POLST-CPM POLST ER 10-8 MG/5ML PO SUER: 5.0000 mL | Freq: Two times a day (BID) | ORAL | 0 refills | Status: DC | PRN

## 2016-07-23 MED ORDER — OSELTAMIVIR PHOSPHATE 75 MG PO CAPS: 75.0000 mg | ORAL_CAPSULE | Freq: Two times a day (BID) | ORAL | 0 refills | Status: DC

## 2016-07-23 NOTE — ED Triage Notes (Signed)
Patient states she started feeling bad late Tuesday night with cough body aches and sore throat

## 2016-07-23 NOTE — ED Provider Notes (Signed)
[facility]    CSN: [id number] Arrival date & time: 07/23/16  0848     History   Chief Complaint Chief Complaint  Patient presents with  . Fever    HPI Tamara [name] is a 51 y.o. female.   Patient reports having a sore throat and nasal congestion coughing. Everything started on Tuesday morning point by Tuesday evening and really kicked in she's had a fever off and on coughing unable to sleep at night aching all over as well. She did get a flu shot earlier. She's has several relatives had cancer mother father signs sister has cervical cancer. She does not smoke. No chronic medical problems other than arthritis of the knees. She's had a C-section left knee was scoped. No known drug allergies. She does not smoke.   The history is provided by the patient and a relative. No language interpreter was used.  Fever  Temp source:  Subjective Severity:  Moderate Chronicity:  New Relieved by:  Acetaminophen and ibuprofen Associated symptoms: chills, congestion, cough, headaches, myalgias, rhinorrhea and sore throat   Associated symptoms: no chest pain and no ear pain     History reviewed. No pertinent past medical history.  Patient Active Problem List   Diagnosis Date Noted  . Primary osteoarthritis of right knee 10/01/2015    Past Surgical History:  Procedure Laterality Date  . CESAREAN SECTION    . Left Knee Arthroscopy      OB History    No data available       Home Medications    Prior to Admission medications   Medication Sig Start Date End Date Taking? Authorizing Provider  chlorpheniramine-HYDROcodone (TUSSIONEX PENNKINETIC ER) 10-8 MG/5ML SUER Take 5 mLs by mouth every 12 (twelve) hours as needed. 07/23/16   Hassan RowanEugene Daichi Moris, MD  fexofenadine-pseudoephedrine (ALLEGRA-D ALLERGY & CONGESTION) 180-240 MG 24 hr tablet Take 1 tablet by mouth daily. 07/23/16   Hassan RowanEugene Linkin Vizzini, MD  ibuprofen (ADVIL,MOTRIN) 800 MG tablet Take by mouth.    Historical Provider, MD    meloxicam (MOBIC) 7.5 MG tablet Take by mouth.    Historical Provider, MD  oseltamivir (TAMIFLU) 75 MG capsule Take 1 capsule (75 mg total) by mouth 2 (two) times daily. 07/23/16   Hassan RowanEugene Alvetta Hidrogo, MD  traMADol Janean Sark(ULTRAM) 50 MG tablet Take by mouth. 07/02/15   Historical Provider, MD    Family History Family History  Problem Relation Age of Onset  . Cancer Mother   . Cancer Father   . Breast cancer Neg Hx     Social History Social History  Substance Use Topics  . Smoking status: Never Smoker  . Smokeless tobacco: Never Used  . Alcohol use Not on file     Allergies   Patient has no known allergies.   Review of Systems Review of Systems  Constitutional: Positive for chills and fever.  HENT: Positive for congestion, rhinorrhea and sore throat. Negative for ear pain.   Respiratory: Positive for cough.   Cardiovascular: Negative for chest pain.  Musculoskeletal: Positive for myalgias.  Neurological: Positive for headaches.  All other systems reviewed and are negative.    Physical Exam Triage Vital Signs ED Triage Vitals  Enc Vitals Group     BP 07/23/16 0909 (!) 142/80     Pulse Rate 07/23/16 0909 83     Resp 07/23/16 0909 18     Temp 07/23/16 0923 99.2 F (37.3 C)     Temp src --      SpO2  07/23/16 0909 99 %     Weight 07/23/16 0911 198 lb (89.8 kg)     Height 07/23/16 0911 5\' 5"  (1.651 m)     Head Circumference --      Peak Flow --      Pain Score 07/23/16 0908 8     Pain Loc --      Pain Edu? --      Excl. in GC? --    No data found.   Updated Vital Signs BP (!) 142/80 (BP Location: Left Arm)   Pulse 83   Temp 99.2 F (37.3 C)   Resp 18   Ht 5\' 5"  (1.651 m)   Wt 198 lb (89.8 kg)   SpO2 99%   BMI 32.95 kg/m   Visual Acuity Right Eye Distance:   Left Eye Distance:   Bilateral Distance:    Right Eye Near:   Left Eye Near:    Bilateral Near:     Physical Exam  Constitutional: She is oriented to person, place, and time. She appears well-developed  and well-nourished.  HENT:  Head: Normocephalic and atraumatic.  Right Ear: External ear normal.  Left Ear: External ear normal.  Mouth/Throat: Oropharynx is clear and moist.  Eyes: Conjunctivae and EOM are normal. Pupils are equal, round, and reactive to light.  Neck: Normal range of motion. Neck supple.  Cardiovascular: Normal rate and regular rhythm.   Pulmonary/Chest: Effort normal and breath sounds normal.  Musculoskeletal: Normal range of motion.  Lymphadenopathy:    She has cervical adenopathy.  Neurological: She is alert and oriented to person, place, and time. No cranial nerve deficit.  Skin: Skin is warm.  Vitals reviewed.    UC Treatments / Results  Labs (all labs ordered are listed, but only abnormal results are displayed) Labs Reviewed  RAPID STREP SCREEN (NOT AT [facility])  CULTURE, GROUP A STREP [facility])    EKG  EKG Interpretation None       Radiology No results found.  Procedures Procedures (including critical care time)  Medications Ordered in UC Medications - No data to display   Initial Impression / Assessment and Plan / UC Course  I have reviewed the triage vital signs and the nursing notes.  Pertinent labs & imaging results that were available during my care of the patient were reviewed by me and considered in my medical decision making (see chart for details).    Results for orders placed or performed during the hospital encounter of 07/23/16  Rapid strep screen  Result Value Ref Range   Streptococcus, Group A Screen (Direct) NEGATIVE NEGATIVE       We'll place patient on Tussionex 1 teaspoon twice a day Allegra-D 1 tablet daily Tamiflu twice a day for 5 days follow-up with PCP if not better in a week work note given for today as well. Final Clinical Impressions(s) / UC Diagnoses   Final diagnoses:  Influenza-like illness  Flu-like symptoms  Pharyngitis, unspecified etiology    New Prescriptions New Prescriptions    CHLORPHENIRAMINE-HYDROCODONE (TUSSIONEX PENNKINETIC ER) 10-8 MG/5ML SUER    Take 5 mLs by mouth every 12 (twelve) hours as needed.   FEXOFENADINE-PSEUDOEPHEDRINE (ALLEGRA-D ALLERGY & CONGESTION) 180-240 MG 24 HR TABLET    Take 1 tablet by mouth daily.   OSELTAMIVIR (TAMIFLU) 75 MG CAPSULE    Take 1 capsule (75 mg total) by mouth 2 (two) times daily.     Note: This dictation was prepared with Dragon dictation along with smaller phrase technology.  Any transcriptional errors that result from this process are unintentional.   Hassan Rowan, MD 07/23/16 [phone]

## 2016-07-25 LAB — CULTURE, GROUP A STREP (THRC)

## 2016-10-27 ENCOUNTER — Other Ambulatory Visit: Payer: Self-pay | Admitting: Obstetrics and Gynecology

## 2016-10-27 DIAGNOSIS — Z1231 Encounter for screening mammogram for malignant neoplasm of breast: Secondary | ICD-10-CM

## 2016-11-17 ENCOUNTER — Ambulatory Visit
Admission: RE | Admit: 2016-11-17 | Discharge: 2016-11-17 | Disposition: A | Discharge status: Home / Self Care | Payer: 59 | Source: Ambulatory Visit | Attending: Obstetrics and Gynecology | Admitting: Obstetrics and Gynecology

## 2016-11-17 DIAGNOSIS — Z1231 Encounter for screening mammogram for malignant neoplasm of breast: Secondary | ICD-10-CM | POA: Insufficient documentation

## 2016-12-04 IMAGING — US US ABDOMEN LIMITED
1 series · 14 of 25 positions shown · non-contrast
Comparison: 11/23/2007.

CLINICAL DATA: Elevated liver enzymes.

EXAM:
US ABDOMEN LIMITED - RIGHT UPPER QUADRANT

[Series 1: us abdomen limited · 0.20mm/px · 14 of 44 slices shown]
[im 1/44]
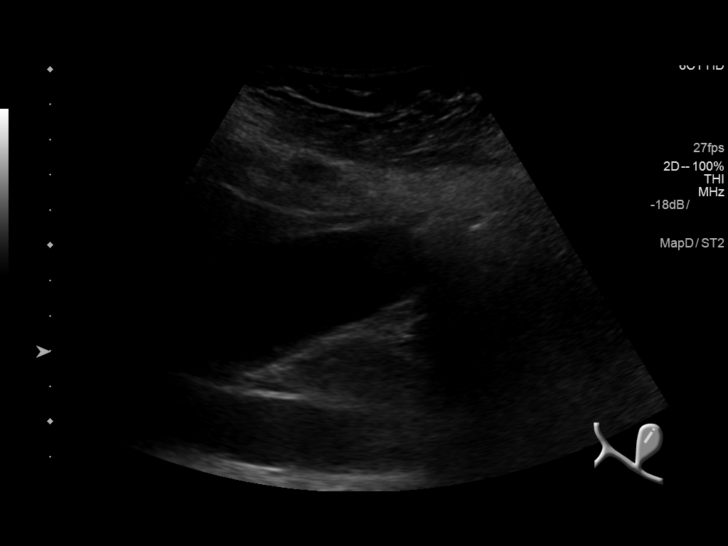
[im 4/44]
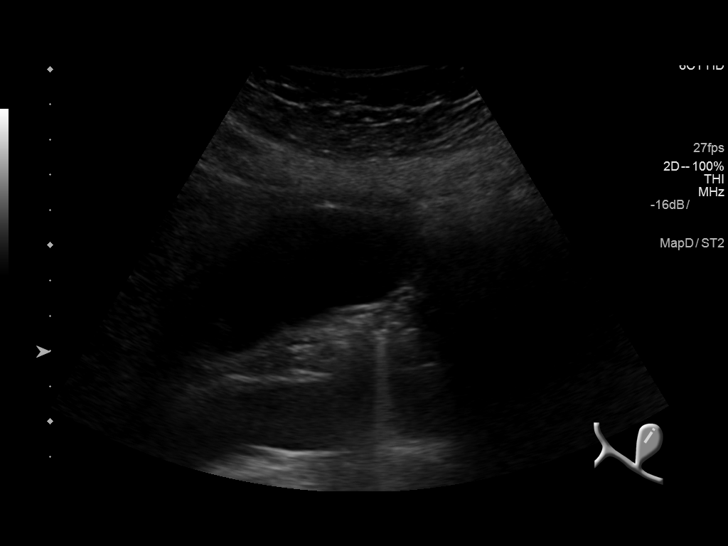
[im 8/44]
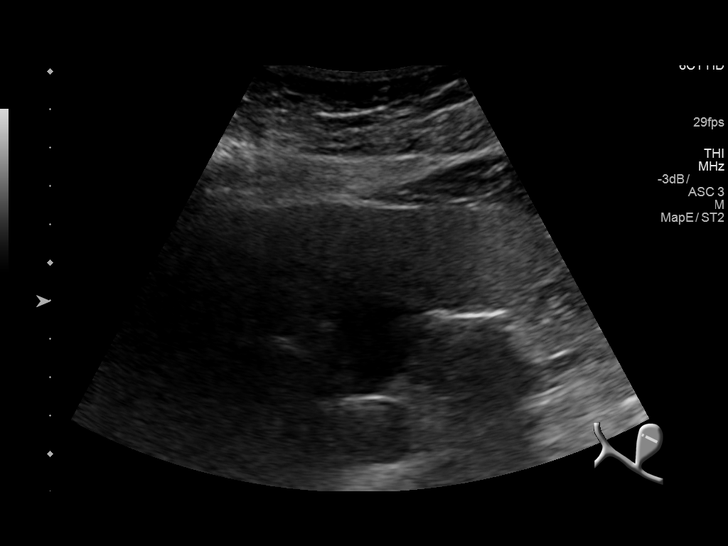
[im 11/44]
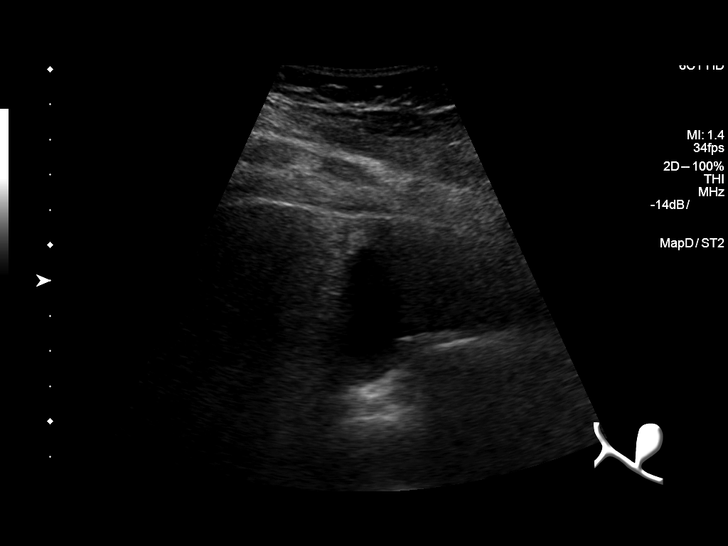
[im 15/44]
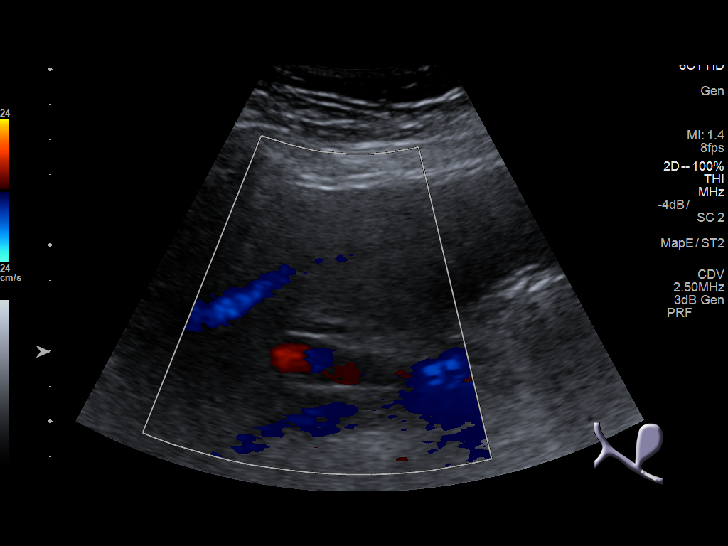
[im 17/44]
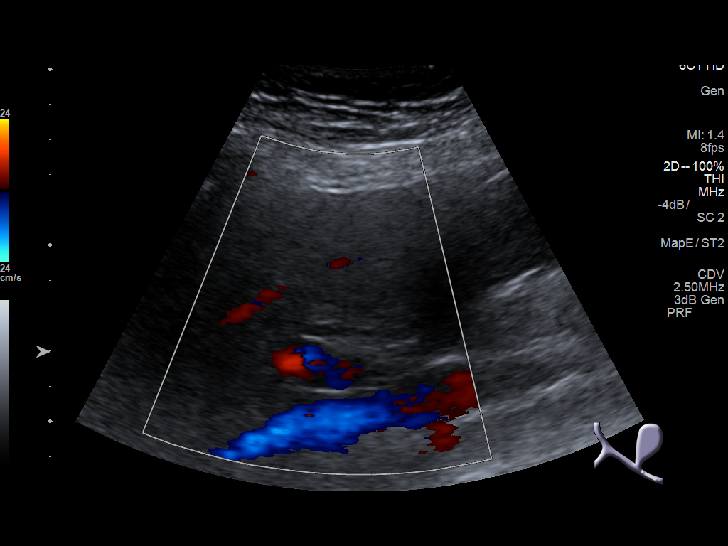
[im 20/44]
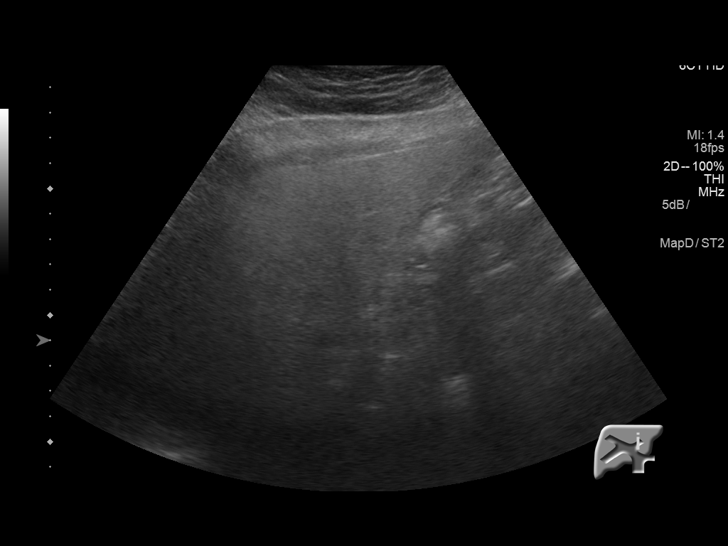
[im 24/44]
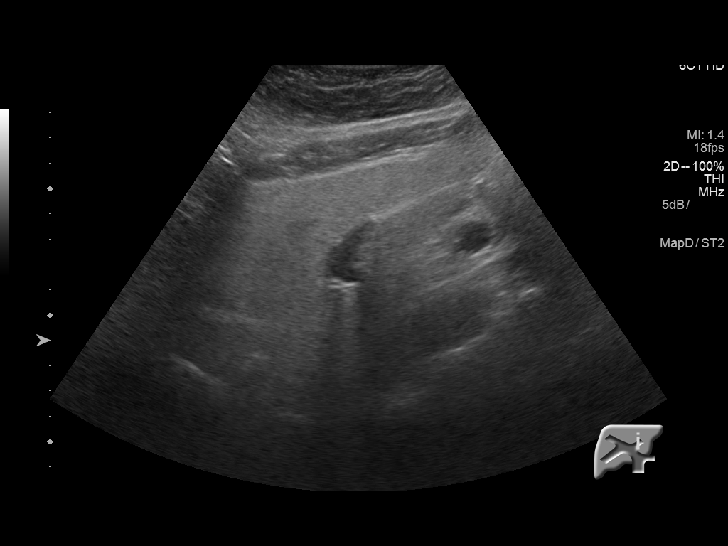
[im 27/44]
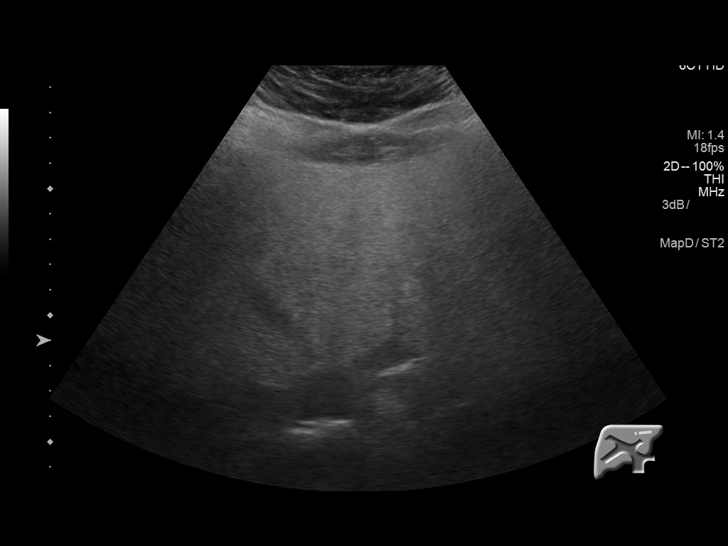
[im 29/44]
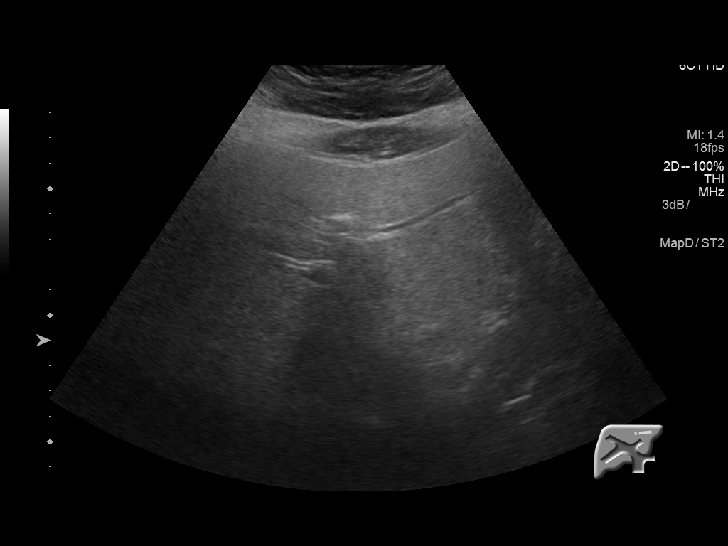
[im 33/44]
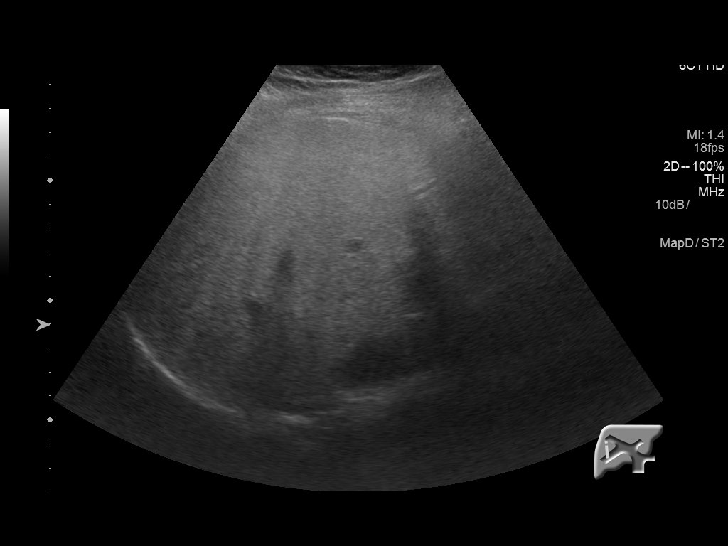
[im 36/44]
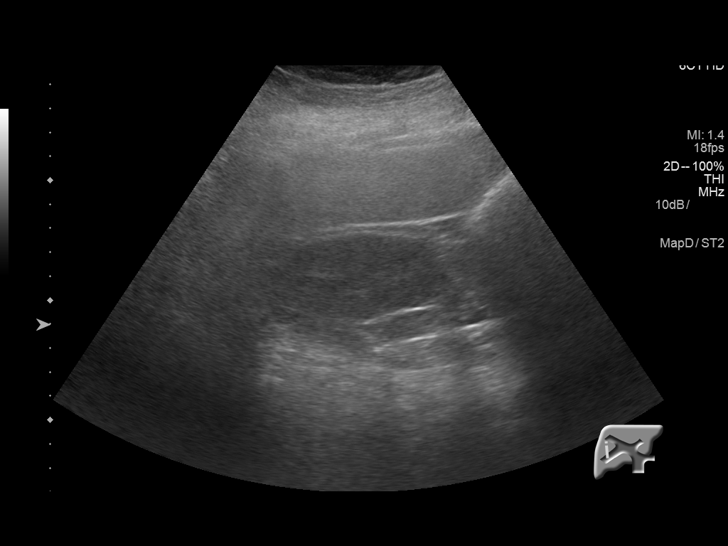
[im 40/44]
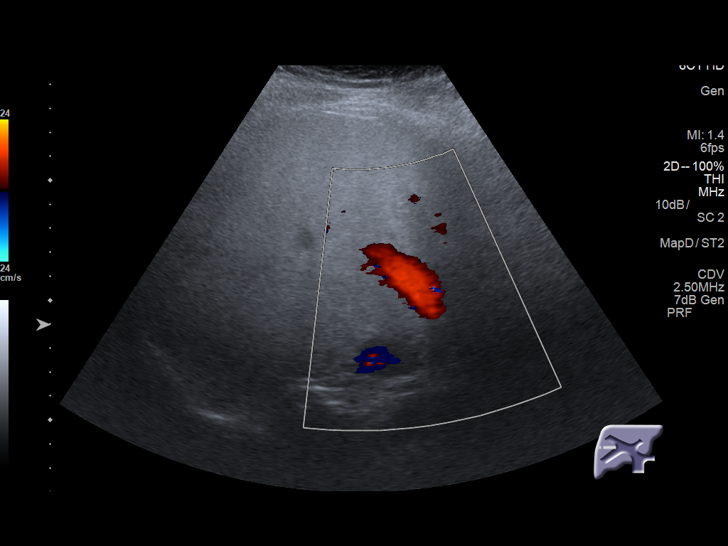
[im 44/44]
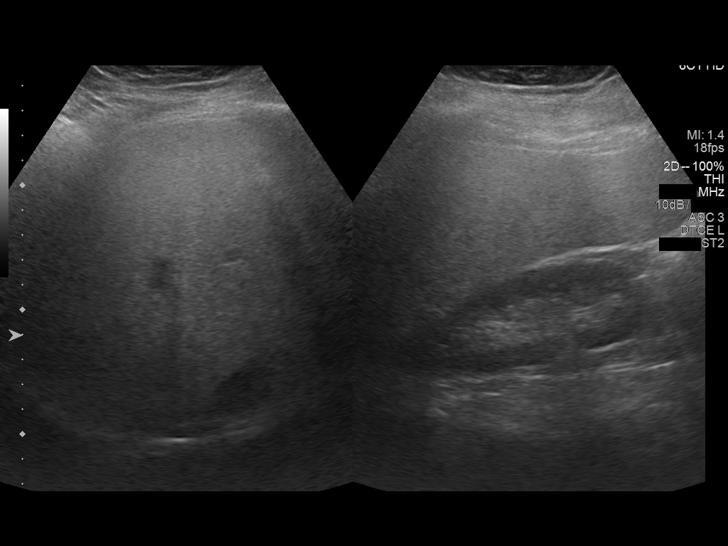

[14 of 25 positions shown; findings below may reference images not displayed]

FINDINGS: Gallbladder:

No gallstones or wall thickening visualized. No sonographic Murphy
sign noted by sonographer.

Common bile duct:

Diameter: 3.5 mm

Liver:

Diffusely echogenic.  No mass seen.
IMPRESSION: Diffusely echogenic liver, most likely due to hepatic steatosis.
Otherwise, normal examination.

## 2017-03-02 ENCOUNTER — Encounter: Payer: Self-pay | Admitting: Family Medicine

## 2017-03-02 ENCOUNTER — Ambulatory Visit (INDEPENDENT_AMBULATORY_CARE_PROVIDER_SITE_OTHER): Payer: 59

## 2017-03-02 DIAGNOSIS — Z23 Encounter for immunization: Secondary | ICD-10-CM | POA: Diagnosis not present

## 2017-07-01 ENCOUNTER — Ambulatory Visit
Admission: EM | Admit: 2017-07-01 | Discharge: 2017-07-01 | Disposition: A | Discharge status: Home / Self Care | Payer: Managed Care, Other (non HMO) | Attending: Emergency Medicine | Admitting: Emergency Medicine

## 2017-07-01 ENCOUNTER — Other Ambulatory Visit: Payer: Self-pay

## 2017-07-01 DIAGNOSIS — H6001 Abscess of right external ear: Secondary | ICD-10-CM

## 2017-07-01 DIAGNOSIS — L089 Local infection of the skin and subcutaneous tissue, unspecified: Secondary | ICD-10-CM

## 2017-07-01 DIAGNOSIS — J029 Acute pharyngitis, unspecified: Secondary | ICD-10-CM

## 2017-07-01 LAB — RAPID STREP SCREEN (MED CTR MEBANE ONLY): STREPTOCOCCUS, GROUP A SCREEN (DIRECT): NEGATIVE

## 2017-07-01 MED ORDER — CEPHALEXIN 500 MG PO CAPS: 500.0000 mg | ORAL_CAPSULE | Freq: Four times a day (QID) | ORAL | 0 refills | Status: AC

## 2017-07-01 MED ORDER — IBUPROFEN 600 MG PO TABS: 600.0000 mg | ORAL_TABLET | Freq: Four times a day (QID) | ORAL | 0 refills | Status: DC | PRN

## 2017-07-01 MED ORDER — MUPIROCIN 2 % EX OINT: 1.0000 "application " | TOPICAL_OINTMENT | Freq: Three times a day (TID) | CUTANEOUS | 0 refills | Status: DC

## 2017-07-01 NOTE — ED Triage Notes (Signed)
Patient complains of sore throat, ear pain and tiredness that started on Tuesday.

## 2017-07-01 NOTE — ED Provider Notes (Signed)
HPI  SUBJECTIVE:  Tamara [name] is a 52 y.o. female who presents with sore throat more on the right than the left starting 2 days ago.  She reports fatigue, chills, but no fevers.  She reports pain with swallowing.  No nasal congestion, rhinorrhea, postnasal drip, body aches.  She reports headaches.  No drooling, trismus, difficulty breathing.  No voice changes, sensation of throat swelling shut.  No abdominal pain, rash.  No allergy or GERD symptoms.  She has tried salt water gargles with improvement in her symptoms.  Symptoms are worse with swallowing.  No strep or mono contacts.  Second, she reports constant, achy right ear pain starting today.  She denies change in hearing, otorrhea.  No foreign body insertion.  No recent swimming.  She tried inserting a cotton wad into her ear without much improvement of symptoms.  Symptoms are worse with exposure to air.  She has a past medical history of mono, syncope, migraines.  Otitis media in childhood.  No history of diabetes, hypertension, recurrent strep, GERD.  PMD: Duanne LimerickJones, Deanna C, MD .   History reviewed. No pertinent past medical history.  Past Surgical History:  Procedure Laterality Date  . CESAREAN SECTION    . Left Knee Arthroscopy      Family History  Problem Relation Age of Onset  . Cancer Mother   . Cancer Father   . Breast cancer Neg Hx     Social History   Tobacco Use  . Smoking status: Never Smoker  . Smokeless tobacco: Never Used  Substance Use Topics  . Alcohol use: No    Frequency: Never  . Drug use: No    No current facility-administered medications for this encounter.   Current Outpatient Medications:  .  cephALEXin (KEFLEX) 500 MG capsule, Take 1 capsule (500 mg total) by mouth 4 (four) times daily for 5 days. X 5 days, Disp: 20 capsule, Rfl: 0 .  ibuprofen (ADVIL,MOTRIN) 600 MG tablet, Take 1 tablet (600 mg total) by mouth every 6 (six) hours as needed., Disp: 30 tablet, Rfl: 0 .  mupirocin ointment (BACTROBAN)  2 %, Apply 1 application topically 3 (three) times daily., Disp: 22 g, Rfl: 0  No Known Allergies   ROS  As noted in HPI.   Physical Exam  BP (!) 150/95 (BP Location: Left Arm)   Pulse 84   Temp 97.8 F (36.6 C) (Oral)   Resp 18   Ht 5\' 5"  (1.651 m)   Wt 192 lb (87.1 kg)   SpO2 100%   BMI 31.95 kg/m   Constitutional: Well developed, well nourished, no acute distress Eyes:  EOMI, conjunctiva normal bilaterally HENT: Normocephalic, atraumatic,mucus membranes moist.  Left TM normal.  Right external ear normal.  Positive tender pustule at the entrance of the external ear canal.  Positive right EAC erythema.  TM normal.  Positive pain with traction on the pinna.  No pain with palpation of tragus.  No EAC swelling.  No tenderness over the mastoid.  No nasal congestion.  Slightly erythematous oropharynx, normal tonsils.  No exudates.  Uvula midline.  No appreciable postnasal drip. : No cervical lymphadenopathy Respiratory: Normal inspiratory effort Cardiovascular: Normal rate regular rhythm, no murmurs, rubs, gallops GI: nondistended splenomegaly. skin: No rash, skin intact Musculoskeletal: no deformities Neurologic: Alert & oriented x 3, no focal neuro deficits Psychiatric: Speech and behavior appropriate   ED Course   Medications - No data to display  Orders Placed This Encounter  Procedures  .  Rapid strep screen    Standing Status:   Standing    Number of Occurrences:   1    Order Specific Question:   Patient immune status    Answer:   Normal  . Culture, group A strep    Standing Status:   Standing    Number of Occurrences:   1    Results for orders placed or performed during the hospital encounter of 07/01/17 (from the past 24 hour(s))  Rapid strep screen     Status: None   Collection Time: 07/01/17  2:36 PM  Result Value Ref Range   Streptococcus, Group A Screen (Direct) NEGATIVE NEGATIVE   No results found.  ED Clinical Impression  Pharyngitis, unspecified  etiology  Pustule   ED Assessment/Plan  Patient with 2 issues. 1.  Pustule in the right external ear canal.  Attempted to drain this with an 18-gauge needle, but while manipulating this, patient became lightheaded, felt as if she was about to pass out.  Gave her something to drink, and examined her throat. she came diaphoretic and vomited this up.  She denied chest pain, shortness of breath, palpitations while feeling lightheaded.  Patient started feeling better after throwing up and lying down.  Suspect vasovagal reaction.  Will send home with Bactroban for her to put over the pustule, Keflex 500 mg 4 times daily for 5 days.    2.  Pharyngitis.  Most likely viral.  Rapid strep negative.  Throat culture sent.  If it comes back positive, then continue her Keflex 500 4 times daily for an additional 5 days.    Discussed labs, MDM, plan and followup with patient. Discussed sn/sx that should prompt return to the ED. patient agrees with plan.   Meds ordered this encounter  Medications  . mupirocin ointment (BACTROBAN) 2 %    Sig: Apply 1 application topically 3 (three) times daily.    Dispense:  22 g    Refill:  0  . cephALEXin (KEFLEX) 500 MG capsule    Sig: Take 1 capsule (500 mg total) by mouth 4 (four) times daily for 5 days. X 5 days    Dispense:  20 capsule    Refill:  0  . ibuprofen (ADVIL,MOTRIN) 600 MG tablet    Sig: Take 1 tablet (600 mg total) by mouth every 6 (six) hours as needed.    Dispense:  30 tablet    Refill:  0    *This clinic note was created using Scientist, clinical (histocompatibility and immunogenetics). Therefore, there may be occasional mistakes despite careful proofreading.   ?   Domenick Gong, MD 07/01/17 1734

## 2017-07-01 NOTE — Discharge Instructions (Signed)
Place the Bactroban in your external ear canal and finish the Keflex unless a provider tells you to stop.  your rapid strep was negative today, so we have sent off a throat culture.   Give us a working phone number.  If it comes back positive for strep, we will continue your Keflex for an additional 5 days.  1 gram of Tylenol and 600 mg ibuprofen together 3-4 times a day as needed for pain.  Make sure you drink plenty of extra fluids.  Some people find salt water gargles and  Traditional Medicinal's "Throat Coat" tea helpful. Take 5 mL of liquid Benadryl and 5 mL of Maalox. Mix it together, and then hold it in your mouth for as long as you can and then swallow. You may do this 4 times a day.    Go to www.goodrx.com to look up your medications. This will give you a list of where you can find your prescriptions at the most affordable prices. Or ask the pharmacist what the cash price is, or if they have any other discount programs available to help make your medication more affordable. This can be less expensive than what you would pay with insurance.

## 2017-07-04 LAB — CULTURE, GROUP A STREP (THRC)

## 2017-07-05 ENCOUNTER — Encounter: Payer: Self-pay | Admitting: Family Medicine

## 2017-07-05 ENCOUNTER — Ambulatory Visit: Payer: Managed Care, Other (non HMO) | Admitting: Family Medicine

## 2017-07-05 VITALS — BP 138/80 | HR 75 | Temp 98.3°F | Resp 16 | Ht 65.0 in | Wt 197.0 lb

## 2017-07-05 DIAGNOSIS — J029 Acute pharyngitis, unspecified: Secondary | ICD-10-CM

## 2017-07-05 DIAGNOSIS — J0100 Acute maxillary sinusitis, unspecified: Secondary | ICD-10-CM

## 2017-07-05 DIAGNOSIS — J40 Bronchitis, not specified as acute or chronic: Secondary | ICD-10-CM | POA: Diagnosis not present

## 2017-07-05 MED ORDER — AMOXICILLIN-POT CLAVULANATE 875-125 MG PO TABS: 1.0000 | ORAL_TABLET | Freq: Two times a day (BID) | ORAL | 0 refills | Status: DC

## 2017-07-05 MED ORDER — GUAIFENESIN-CODEINE 100-10 MG/5ML PO SYRP: 5.0000 mL | ORAL_SOLUTION | Freq: Three times a day (TID) | ORAL | 0 refills | Status: DC | PRN

## 2017-07-05 NOTE — Progress Notes (Signed)
Name: Tamara [name]   MRN: [id number]    DOB: 18-Sep-1965   Date:07/05/2017       Progress Note  Subjective  Chief Complaint  Chief Complaint  Patient presents with  . Ear Pain    Using Bactriban on ear where MUC messed with bump inside ear causing her to be dizzy and throw up. R ear pain saw MUC Thursday no different after on Abx   . Sore Throat    [facility] Strep [facility] at [facility]    Sore Throat   This is a new problem. The current episode started in the past 7 days (last Tuesday). The problem has been unchanged. The pain is worse on the right side. Maximum temperature: low grade yesterday. The fever has been present for 1 to 2 days. The pain is mild. Associated symptoms include congestion, coughing, ear pain, headaches and swollen glands. Pertinent negatives include no abdominal pain, diarrhea, ear discharge, neck pain or shortness of breath. She has had no exposure to strep or mono. Treatments tried: cephalexin. The treatment provided moderate relief.    No problem-specific Assessment & Plan notes found for this encounter.   History reviewed. No pertinent past medical history.  Past Surgical History:  Procedure Laterality Date  . CESAREAN SECTION    . Left Knee Arthroscopy      Family History  Problem Relation Age of Onset  . Cancer Mother   . Cancer Father   . Breast cancer Neg Hx     Social History   Socioeconomic History  . Marital status: Married    Spouse name: Not on file  . Number of children: Not on file  . Years of education: Not on file  . Highest education level: Not on file  Social Needs  . Financial resource strain: Not on file  . Food insecurity - worry: Not on file  . Food insecurity - inability: Not on file  . Transportation needs - medical: Not on file  . Transportation needs - non-medical: Not on file  Occupational History  . Not on file  Tobacco Use  . Smoking status: Never Smoker  . Smokeless tobacco: Never Used  Substance and Sexual Activity  .  Alcohol use: No    Frequency: Never  . Drug use: No  . Sexual activity: Not on file  Other Topics Concern  . Not on file  Social History Narrative  . Not on file    No Known Allergies  Outpatient Medications Prior to Visit  Medication Sig Dispense Refill  . cephALEXin (KEFLEX) 500 MG capsule Take 1 capsule (500 mg total) by mouth 4 (four) times daily for 5 days. X 5 days 20 capsule 0  . ibuprofen (ADVIL,MOTRIN) 600 MG tablet Take 1 tablet (600 mg total) by mouth every 6 (six) hours as needed. 30 tablet 0  . mupirocin ointment (BACTROBAN) 2 % Apply 1 application topically 3 (three) times daily. 22 g 0   No facility-administered medications prior to visit.     Review of Systems  Constitutional: Negative for chills, fever, malaise/fatigue and weight loss.  HENT: Positive for congestion and ear pain. Negative for ear discharge and sore throat.   Eyes: Negative for blurred vision.  Respiratory: Positive for cough. Negative for sputum production, shortness of breath and wheezing.   Cardiovascular: Negative for chest pain, palpitations and leg swelling.  Gastrointestinal: Negative for abdominal pain, blood in stool, constipation, diarrhea, heartburn, melena and nausea.  Genitourinary: Negative for dysuria, frequency, hematuria and urgency.  Musculoskeletal: Negative for back pain, joint pain, myalgias and neck pain.  Skin: Negative for rash.  Neurological: Positive for headaches. Negative for dizziness, tingling, sensory change and focal weakness.  Endo/Heme/Allergies: Negative for environmental allergies and polydipsia. Does not bruise/bleed easily.  Psychiatric/Behavioral: Negative for depression and suicidal ideas. The patient is not nervous/anxious and does not have insomnia.      Objective  Vitals:   07/05/17 1404  BP: 138/80  Pulse: 75  Resp: 16  Temp: 98.3 F (36.8 C)  TempSrc: Oral  SpO2: 98%  Weight: 197 lb (89.4 kg)  Height: 5\' 5"  (1.651 m)    Physical Exam   Constitutional: She is well-developed, well-nourished, and in no distress. No distress.  HENT:  Head: Normocephalic and atraumatic.  Right Ear: Tympanic membrane, external ear and ear canal normal.  Left Ear: Tympanic membrane, external ear and ear canal normal.  Nose: Right sinus exhibits maxillary sinus tenderness and frontal sinus tenderness. Left sinus exhibits maxillary sinus tenderness and frontal sinus tenderness.  Mouth/Throat: Posterior oropharyngeal erythema present. No oropharyngeal exudate or posterior oropharyngeal edema.  Eyes: Conjunctivae and EOM are normal. Pupils are equal, round, and reactive to light. Right eye exhibits no discharge. Left eye exhibits no discharge.  Neck: Normal range of motion. Neck supple. No JVD present. No thyromegaly present.  Cardiovascular: Normal rate, regular rhythm, normal heart sounds and intact distal pulses. Exam reveals no gallop and no friction rub.  No murmur heard. Pulmonary/Chest: Effort normal and breath sounds normal. She has no wheezes. She has no rales.  Abdominal: Soft. Bowel sounds are normal. She exhibits no mass. There is no tenderness. There is no guarding.  Musculoskeletal: Normal range of motion. She exhibits no edema.  Lymphadenopathy:       Head (right side): Submandibular adenopathy present.       Head (left side): Submandibular adenopathy present.    She has no cervical adenopathy.  tender  Neurological: She is alert. She has normal reflexes.  Skin: Skin is warm and dry. She is not diaphoretic.  Psychiatric: Mood and affect normal.  Nursing note and vitals reviewed.     Assessment & Plan  Problem List Items Addressed This Visit    None    Visit Diagnoses    Acute maxillary sinusitis, recurrence not specified    -  Primary   Relevant Medications   amoxicillin-clavulanate (AUGMENTIN) 875-125 MG tablet   guaiFENesin-codeine (ROBITUSSIN AC) 100-10 MG/5ML syrup   Pharyngitis, unspecified etiology       Bronchitis        Relevant Medications   guaiFENesin-codeine (ROBITUSSIN AC) 100-10 MG/5ML syrup      Meds ordered this encounter  Medications  . amoxicillin-clavulanate (AUGMENTIN) 875-125 MG tablet    Sig: Take 1 tablet by mouth 2 (two) times daily.    Dispense:  20 tablet    Refill:  0  . guaiFENesin-codeine (ROBITUSSIN AC) 100-10 MG/5ML syrup    Sig: Take 5 mLs by mouth 3 (three) times daily as needed for cough.    Dispense:  100 mL    Refill:  0      Dr. Elizabeth Sauereanna Corrinne Benegas [facility]  07/05/17

## 2017-10-01 ENCOUNTER — Ambulatory Visit: Payer: Managed Care, Other (non HMO) | Admitting: Family Medicine

## 2017-10-05 ENCOUNTER — Encounter: Payer: Self-pay | Admitting: Emergency Medicine

## 2017-10-05 ENCOUNTER — Ambulatory Visit (INDEPENDENT_AMBULATORY_CARE_PROVIDER_SITE_OTHER): Payer: Managed Care, Other (non HMO)

## 2017-10-05 ENCOUNTER — Ambulatory Visit
Admission: EM | Admit: 2017-10-05 | Discharge: 2017-10-05 | Disposition: A | Discharge status: Home / Self Care | Payer: Managed Care, Other (non HMO) | Attending: Emergency Medicine | Admitting: Emergency Medicine

## 2017-10-05 ENCOUNTER — Other Ambulatory Visit: Payer: Self-pay

## 2017-10-05 DIAGNOSIS — M25562 Pain in left knee: Secondary | ICD-10-CM | POA: Diagnosis not present

## 2017-10-05 DIAGNOSIS — M25462 Effusion, left knee: Secondary | ICD-10-CM

## 2017-10-05 NOTE — ED Triage Notes (Signed)
Left knee pain for 1 week. Hurts to walk or put pressure on knee. Tried to use brace but it only help with pain for a few days. Have shooting pain.

## 2017-10-05 NOTE — ED Provider Notes (Addendum)
[facility]    CSN: [id number] Arrival date & time: 10/05/17  1716     History   Chief Complaint Chief Complaint  Patient presents with  . Knee Pain    HPI Tamara [name] is a 53 y.o. female.   HPI  52 year old female is with left-sided knee pain that she has had for 1 week.  Had no injury that she is aware of.  He said it hurts to walk or put pressure on her knee.  One occasion of the knee lock on her was manipulated before she is able to fully extend the knee.  Noticed some swelling.  Using several different braces that she has but helped only for a few days and then the pain returned.  She has an appointment with [facility] clinic on Friday but was unable to wait that long and needed to be seen today.  She has a definite antalgic gait.  Review of her records shows that she has had problems with her right knee in the past and has primary osteoarthritis along with PF problems.  These have cleared up and she does not have any discomfort in the right knee at this time.    History reviewed. No pertinent past medical history.  Patient Active Problem List   Diagnosis Date Noted  . Primary osteoarthritis of right knee 10/01/2015    Past Surgical History:  Procedure Laterality Date  . CESAREAN SECTION    . Left Knee Arthroscopy      OB History   None      Home Medications    Prior to Admission medications   Medication Sig Start Date End Date Taking? Authorizing Provider  ibuprofen (ADVIL,MOTRIN) 600 MG tablet Take 1 tablet (600 mg total) by mouth every 6 (six) hours as needed. 07/01/17  Yes Domenick Gong, MD  amoxicillin-clavulanate (AUGMENTIN) 875-125 MG tablet Take 1 tablet by mouth 2 (two) times daily. 07/05/17   Duanne Limerick, MD  guaiFENesin-codeine (ROBITUSSIN AC) 100-10 MG/5ML syrup Take 5 mLs by mouth 3 (three) times daily as needed for cough. 07/05/17   Duanne Limerick, MD  mupirocin ointment (BACTROBAN) 2 % Apply 1 application topically 3 (three)  times daily. 07/01/17   Domenick Gong, MD    Family History Family History  Problem Relation Age of Onset  . Cancer Mother   . Cancer Father   . Breast cancer Neg Hx     Social History Social History   Tobacco Use  . Smoking status: Never Smoker  . Smokeless tobacco: Never Used  Substance Use Topics  . Alcohol use: No    Frequency: Never  . Drug use: No     Allergies   Patient has no known allergies.   Review of Systems Review of Systems  Constitutional: Positive for activity change. Negative for appetite change, chills, fatigue and fever.  Musculoskeletal: Positive for arthralgias and gait problem.  All other systems reviewed and are negative.    Physical Exam Triage Vital Signs ED Triage Vitals  Enc Vitals Group     BP 10/05/17 1808 131/69     Pulse Rate 10/05/17 1808 71     Resp 10/05/17 1808 16     Temp 10/05/17 1808 98.1 F (36.7 C)     Temp Source 10/05/17 1808 Oral     SpO2 10/05/17 1808 99 %     Weight 10/05/17 1810 193 lb (87.5 kg)     Height 10/05/17 1810  (1.651 m)  Head Circumference --      Peak Flow --      Pain Score 10/05/17 1807 9     Pain Loc --      Pain Edu? --      Excl. in GC? --    No data found.  Updated Vital Signs BP 131/69 (BP Location: Right Arm)   Pulse 71   Temp 98.1 F (36.7 C) (Oral)   Resp 16   Ht  (1.651 m)   Wt 193 lb (87.5 kg)   SpO2 99%   BMI 32.12 kg/m   Visual Acuity Right Eye Distance:   Left Eye Distance:   Bilateral Distance:    Right Eye Near:   Left Eye Near:    Bilateral Near:     Physical Exam  Constitutional: She is oriented to person, place, and time. She appears well-developed and well-nourished. No distress.  HENT:  Head: Normocephalic.  Eyes: Pupils are equal, round, and reactive to light. Right eye exhibits no discharge. Left eye exhibits no discharge.  Neck: Normal range of motion.  Musculoskeletal: She exhibits edema and tenderness.  Examination of the left knee  shows a small  effusion present.  Has a good quadriceps with good control.  Her motion is from 0degrees to 95 degrees in the seated position.  He does have mild retropatellar tenderness.  Tenderness exhibited  over the lateral joint line.  Collateral ligaments are intact.  She has a negative anterior drawer sign.  Neurological: She is alert and oriented to person, place, and time.  Skin: Skin is warm and dry. She is not diaphoretic.  Psychiatric: She has a normal mood and affect. Her behavior is normal. Judgment and thought content normal.  Nursing note and vitals reviewed.    UC Treatments / Results  Labs (all labs ordered are listed, but only abnormal results are displayed) Labs Reviewed - No data to display  EKG None  Radiology Dg Knee Complete 4 Views Left  Result Date: 10/05/2017 CLINICAL DATA:  LEFT medial knee pain for week, possible injury while gardening. EXAM: LEFT KNEE - COMPLETE 4+ VIEW COMPARISON:  None. FINDINGS: No evidence of fracture, dislocation, or joint effusion. No evidence of arthropathy or other focal bone abnormality. Soft tissues are unremarkable. IMPRESSION: Negative. Electronically Signed   By: Awilda Metro M.D.   On: 10/05/2017 20:03    Procedures Procedures (including critical care time)  Medications Ordered in UC Medications - No data to display  Initial Impression / Assessment and Plan / UC Course  I have reviewed the triage vital signs and the nursing notes.  Pertinent labs & imaging results that were available during my care of the patient were reviewed by me and considered in my medical decision making (see chart for details).     Plan: 1. Test/x-ray results and diagnosis reviewed with patient 2. rx as per orders; risks, benefits, potential side effects reviewed with patient 3. Recommend supportive treatment with rest ice and elevation.  Use the knee immobilizer for activities.  Follow up with your orthopedic surgeon Friday as arranged.   Concerned for possible internal derangement of the lateral joint. 4. F/u prn if symptoms worsen or don't improve  Final Clinical Impressions(s) / UC Diagnoses   Final diagnoses:  Effusion of left knee  Acute pain of left knee     Discharge Instructions     Apply ice 20 minutes out of every 2 hours 4-5 times daily for comfort.  Follow-up with orthopedics  on Friday.  Recommend increasing your dose of ibuprofen to 800 mg 3 times daily with food.    ED Prescriptions    None     Controlled Substance Prescriptions [place] Controlled Substance Registry consulted? Not Applicable   Lutricia Feil, PA-C 10/05/17 2038    Lutricia Feil, PA-C 10/05/17 2038

## 2017-10-05 NOTE — Discharge Instructions (Signed)
Apply ice 20 minutes out of every 2 hours 4-5 times daily for comfort.  Follow-up with orthopedics on Friday.  Recommend increasing your dose of ibuprofen to 800 mg 3 times daily with food.

## 2017-10-08 ENCOUNTER — Other Ambulatory Visit: Payer: Self-pay | Admitting: Sports Medicine

## 2017-10-08 DIAGNOSIS — S8992XA Unspecified injury of left lower leg, initial encounter: Secondary | ICD-10-CM

## 2017-10-08 DIAGNOSIS — M25562 Pain in left knee: Secondary | ICD-10-CM

## 2017-10-08 DIAGNOSIS — M25462 Effusion, left knee: Secondary | ICD-10-CM

## 2017-10-20 ENCOUNTER — Ambulatory Visit
Admission: RE | Admit: 2017-10-20 | Discharge: 2017-10-20 | Disposition: A | Discharge status: Home / Self Care | Payer: Managed Care, Other (non HMO) | Source: Ambulatory Visit | Attending: Sports Medicine | Admitting: Sports Medicine

## 2017-10-20 DIAGNOSIS — M25462 Effusion, left knee: Secondary | ICD-10-CM | POA: Diagnosis not present

## 2017-10-20 DIAGNOSIS — M25562 Pain in left knee: Secondary | ICD-10-CM | POA: Diagnosis present

## 2017-10-20 DIAGNOSIS — S8992XA Unspecified injury of left lower leg, initial encounter: Secondary | ICD-10-CM

## 2017-10-21 ENCOUNTER — Ambulatory Visit: Admission: RE | Admit: 2017-10-21 | Payer: Managed Care, Other (non HMO) | Source: Ambulatory Visit

## 2017-11-02 ENCOUNTER — Other Ambulatory Visit: Payer: Self-pay | Admitting: Obstetrics and Gynecology

## 2017-11-02 DIAGNOSIS — Z1231 Encounter for screening mammogram for malignant neoplasm of breast: Secondary | ICD-10-CM

## 2017-11-10 ENCOUNTER — Ambulatory Visit: Payer: Managed Care, Other (non HMO) | Admitting: Family Medicine

## 2017-11-10 ENCOUNTER — Encounter: Payer: Self-pay | Admitting: Family Medicine

## 2017-11-10 VITALS — BP 120/66 | HR 80 | Ht 65.0 in | Wt 198.0 lb

## 2017-11-10 DIAGNOSIS — E7889 Other lipoprotein metabolism disorders: Secondary | ICD-10-CM | POA: Diagnosis not present

## 2017-11-10 DIAGNOSIS — E8889 Other specified metabolic disorders: Secondary | ICD-10-CM

## 2017-11-10 DIAGNOSIS — R748 Abnormal levels of other serum enzymes: Secondary | ICD-10-CM

## 2017-11-10 DIAGNOSIS — E663 Overweight: Secondary | ICD-10-CM

## 2017-11-10 DIAGNOSIS — E7800 Pure hypercholesterolemia, unspecified: Secondary | ICD-10-CM

## 2017-11-10 NOTE — Progress Notes (Signed)
Name: Tamara [name]   MRN: [id number]    DOB: 08/31/1965   Date:11/10/2017       Progress Note  Subjective  Chief Complaint  Chief Complaint  Patient presents with  . lab consult    elevated liver function    GI Problem  Primary symptoms do not include fever, weight loss, fatigue, abdominal pain, nausea, vomiting, diarrhea, melena, hematemesis, jaundice, hematochezia, dysuria, myalgias, arthralgias or rash.  The illness does not include chills, constipation or back pain.    No problem-specific Assessment & Plan notes found for this encounter.   History reviewed. No pertinent past medical history.  Past Surgical History:  Procedure Laterality Date  . CESAREAN SECTION    . Left Knee Arthroscopy      Family History  Problem Relation Age of Onset  . Cancer Mother   . Cancer Father   . Breast cancer Neg Hx     Social History   Socioeconomic History  . Marital status: Married    Spouse name: Not on file  . Number of children: Not on file  . Years of education: Not on file  . Highest education level: Not on file  Occupational History  . Not on file  Social Needs  . Financial resource strain: Not on file  . Food insecurity:    Worry: Not on file    Inability: Not on file  . Transportation needs:    Medical: Not on file    Non-medical: Not on file  Tobacco Use  . Smoking status: Never Smoker  . Smokeless tobacco: Never Used  Substance and Sexual Activity  . Alcohol use: No    Frequency: Never  . Drug use: No  . Sexual activity: Not on file  Lifestyle  . Physical activity:    Days per week: Not on file    Minutes per session: Not on file  . Stress: Not on file  Relationships  . Social connections:    Talks on phone: Not on file    Gets together: Not on file    Attends religious service: Not on file    Active member of club or organization: Not on file    Attends meetings of clubs or organizations: Not on file    Relationship status: Not on file  . Intimate  partner violence:    Fear of current or ex partner: Not on file    Emotionally abused: Not on file    Physically abused: Not on file    Forced sexual activity: Not on file  Other Topics Concern  . Not on file  Social History Narrative  . Not on file    No Known Allergies  Outpatient Medications Prior to Visit  Medication Sig Dispense Refill  . meloxicam (MOBIC) 15 MG tablet Take 15 mg by mouth daily.    . norethindrone-ethinyl estradiol (FEMHRT 1/5) 1-5 MG-MCG TABS tablet Take 1 tablet by mouth daily.    Marland Kitchen amoxicillin-clavulanate (AUGMENTIN) 875-125 MG tablet Take 1 tablet by mouth 2 (two) times daily. 20 tablet 0  . guaiFENesin-codeine (ROBITUSSIN AC) 100-10 MG/5ML syrup Take 5 mLs by mouth 3 (three) times daily as needed for cough. 100 mL 0  . ibuprofen (ADVIL,MOTRIN) 600 MG tablet Take 1 tablet (600 mg total) by mouth every 6 (six) hours as needed. 30 tablet 0  . mupirocin ointment (BACTROBAN) 2 % Apply 1 application topically 3 (three) times daily. 22 g 0   No facility-administered medications prior to visit.  Review of Systems  Constitutional: Negative for chills, fatigue, fever, malaise/fatigue and weight loss.  HENT: Negative for ear discharge, ear pain and sore throat.   Eyes: Negative for blurred vision.  Respiratory: Negative for cough, sputum production, shortness of breath and wheezing.   Cardiovascular: Negative for chest pain, palpitations and leg swelling.  Gastrointestinal: Negative for abdominal pain, blood in stool, constipation, diarrhea, heartburn, hematemesis, hematochezia, jaundice, melena, nausea and vomiting.  Genitourinary: Negative for dysuria, frequency, hematuria and urgency.  Musculoskeletal: Negative for arthralgias, back pain, joint pain, myalgias and neck pain.  Skin: Negative for rash.  Neurological: Negative for dizziness, tingling, sensory change, focal weakness and headaches.  Endo/Heme/Allergies: Negative for environmental allergies and  polydipsia. Does not bruise/bleed easily.  Psychiatric/Behavioral: Negative for depression and suicidal ideas. The patient is not nervous/anxious and does not have insomnia.      Objective  Vitals:   11/10/17 0910  BP: 120/66  Pulse: 80  Weight: 198 lb (89.8 kg)  Height: 5\' 5"  (1.651 m)    Physical Exam  Constitutional: No distress.  HENT:  Head: Normocephalic and atraumatic.  Right Ear: External ear normal.  Left Ear: External ear normal.  Nose: Nose normal.  Mouth/Throat: Oropharynx is clear and moist.  Eyes: Pupils are equal, round, and reactive to light. Conjunctivae and EOM are normal. Right eye exhibits no discharge. Left eye exhibits no discharge.  Neck: Normal range of motion. Neck supple. No JVD present. No thyromegaly present.  Cardiovascular: Normal rate, regular rhythm, normal heart sounds and intact distal pulses. Exam reveals no gallop and no friction rub.  No murmur heard. Pulmonary/Chest: Effort normal and breath sounds normal.  Abdominal: Soft. Bowel sounds are normal. She exhibits no mass. There is no hepatosplenomegaly. There is no tenderness. There is no guarding.  Musculoskeletal: Normal range of motion. She exhibits no edema.  Lymphadenopathy:    She has no cervical adenopathy.  Neurological: She is alert. She has normal reflexes.  Skin: Skin is warm and dry. She is not diaphoretic.      Assessment & Plan  Problem List Items Addressed This Visit    None    Visit Diagnoses    Elevated liver enzymes    -  Primary   minimal elevated. Will refer to gi for evaluation and treatment   Relevant Orders   Ambulatory referral to Gastroenterology   Overweight (BMI 25.0-29.9)       low clororie diet discussed   Pure hypercholesterolemia       Reviewed labs. Diet to lower cholesterol discussed   Steatosis       Reviewed ultrasound fromm 2017. Refer to GI   Relevant Orders   Ambulatory referral to Gastroenterology    Health risks of being over weight  were discussed and patient was counseled on weight loss options and exercise.  No orders of the defined types were placed in this encounter.     Dr. Hayden Rasmusseneanna Cordarrell Sane [facility]  11/10/17

## 2017-11-23 ENCOUNTER — Ambulatory Visit
Admission: RE | Admit: 2017-11-23 | Discharge: 2017-11-23 | Disposition: A | Discharge status: Home / Self Care | Payer: Managed Care, Other (non HMO) | Source: Ambulatory Visit | Attending: Obstetrics and Gynecology | Admitting: Obstetrics and Gynecology

## 2017-11-23 DIAGNOSIS — Z1231 Encounter for screening mammogram for malignant neoplasm of breast: Secondary | ICD-10-CM | POA: Diagnosis not present

## 2018-01-05 ENCOUNTER — Other Ambulatory Visit: Payer: Self-pay

## 2018-01-05 ENCOUNTER — Encounter (INDEPENDENT_AMBULATORY_CARE_PROVIDER_SITE_OTHER): Payer: Self-pay

## 2018-01-05 ENCOUNTER — Encounter

## 2018-01-05 ENCOUNTER — Encounter: Payer: Self-pay | Admitting: Gastroenterology

## 2018-01-05 ENCOUNTER — Ambulatory Visit: Payer: Managed Care, Other (non HMO) | Admitting: Gastroenterology

## 2018-01-05 VITALS — BP 138/68 | HR 70 | Ht 65.0 in | Wt 202.0 lb

## 2018-01-05 DIAGNOSIS — R748 Abnormal levels of other serum enzymes: Secondary | ICD-10-CM

## 2018-01-05 DIAGNOSIS — K760 Fatty (change of) liver, not elsewhere classified: Secondary | ICD-10-CM | POA: Diagnosis not present

## 2018-01-05 NOTE — Patient Instructions (Signed)
You are scheduled for a RUQ abdominal US/elastography at [facility] on Tuesday, Sept 3rd at 9:30am. Please arrive at the medical mall registration desk at 9:15am. You cannot have anything to eat or drink after midnight on Monday night.   If you need to reschedule this appointment for any reason, please contact central scheduling at [phone].

## 2018-01-05 NOTE — Progress Notes (Signed)
    Primary Care Physician: Duanne LimerickJones, Deanna C, MD  Primary Gastroenterologist:  Dr. Midge Miniumarren Karmella Bouvier  Chief Complaint  Patient presents with  . Elevated Hepatic Enzymes    HPI: Tamara [name] is a 52 y.o. female here for follow-up of abnormal liver enzymes. The patient has had chronically elevated liver enzymes and an ultrasound that showed her to have fatty liver disease.  The patient has not lost any weight since I had last seen her.  She denies any abdominal pain dark urine dark stools rectal bleeding.  There is no report of any abdominal pain.  She does report that she has been having some indigestion with heartburn symptoms.  Current Outpatient Medications  Medication Sig Dispense Refill  . meloxicam (MOBIC) 15 MG tablet Take 15 mg by mouth daily.    . norethindrone-ethinyl estradiol (FEMHRT 1/5) 1-5 MG-MCG TABS tablet Take 1 tablet by mouth daily.     No current facility-administered medications for this visit.     Allergies as of 01/05/2018  . (No Known Allergies)    ROS:  General: Negative for anorexia, weight loss, fever, chills, fatigue, weakness. ENT: Negative for hoarseness, difficulty swallowing , nasal congestion. CV: Negative for chest pain, angina, palpitations, dyspnea on exertion, peripheral edema.  Respiratory: Negative for dyspnea at rest, dyspnea on exertion, cough, sputum, wheezing.  GI: See history of present illness. GU:  Negative for dysuria, hematuria, urinary incontinence, urinary frequency, nocturnal urination.  Endo: Negative for unusual weight change.    Physical Examination:   BP 138/68   Pulse 70   Ht 5\' 5"  (1.651 m)   Wt 202 lb (91.6 kg)   BMI 33.61 kg/m   General: Well-nourished, well-developed in no acute distress.  Eyes: No icterus. Conjunctivae pink. Mouth: Oropharyngeal mucosa moist and pink , no lesions erythema or exudate. Lungs: Clear to auscultation bilaterally. Non-labored. Heart: Regular rate and rhythm, no murmurs rubs or gallops.    Abdomen: Bowel sounds are normal, nontender, nondistended, no hepatosplenomegaly or masses, no abdominal bruits or hernia , no rebound or guarding.   Extremities: No lower extremity edema. No clubbing or deformities. Neuro: Alert and oriented x 3.  Grossly intact. Skin: Warm and dry, no jaundice.   Psych: Alert and cooperative, normal mood and affect.  Labs:    Imaging Studies: No results found.  Assessment and Plan:   Tamara [name] is a 52 y.o. y/o female with a history of fatty liver and obesity.  The patient has been told to lose weight and follow up in 3 months to have her liver enzymes checked again.  The patient will also be started on Dexilant and has been given samples for her heartburn.  The patient will have her liver enzymes checked again and will also be set up for a fibrosis scan.  The patient has been explained the plan and agrees with it.    Tamara Miniumarren Jordani Nunn, MD. Clementeen GrahamFACG   Note: This dictation was prepared with Dragon dictation along with smaller phrase technology. Any transcriptional errors that result from this process are unintentional.

## 2018-01-06 LAB — HEPATIC FUNCTION PANEL
ALBUMIN: 4.2 g/dL (ref 3.5–5.5)
ALK PHOS: 70 IU/L (ref 39–117)
ALT: 70 IU/L — ABNORMAL HIGH (ref 0–32)
AST: 51 IU/L — AB (ref 0–40)
Bilirubin Total: 0.2 mg/dL (ref 0.0–1.2)
Bilirubin, Direct: 0.09 mg/dL (ref 0.00–0.40)
TOTAL PROTEIN: 7.1 g/dL (ref 6.0–8.5)

## 2018-01-11 ENCOUNTER — Ambulatory Visit
Admission: RE | Admit: 2018-01-11 | Discharge: 2018-01-11 | Disposition: A | Discharge status: Home / Self Care | Payer: Managed Care, Other (non HMO) | Source: Ambulatory Visit | Attending: Gastroenterology | Admitting: Gastroenterology

## 2018-01-11 DIAGNOSIS — R748 Abnormal levels of other serum enzymes: Secondary | ICD-10-CM | POA: Diagnosis not present

## 2018-01-13 ENCOUNTER — Telehealth: Payer: Self-pay

## 2018-01-13 NOTE — Telephone Encounter (Signed)
Pt is calling to speak with Ginger

## 2018-01-13 NOTE — Telephone Encounter (Signed)
LVM for pt to return my call.

## 2018-01-13 NOTE — Telephone Encounter (Signed)
-----   Message from Midge Minium, MD sent at 01/06/2018 10:24 AM EDT ----- Let the patient know that the LFT's are about the same as before.

## 2018-01-13 NOTE — Telephone Encounter (Signed)
Returned pt's call and advised her of the lab and [place] results.

## 2018-01-13 NOTE — Telephone Encounter (Signed)
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 She should continue to try and loose weight.

## 2018-02-21 ENCOUNTER — Ambulatory Visit
Admission: RE | Admit: 2018-02-21 | Discharge: 2018-02-21 | Disposition: A | Discharge status: Home / Self Care | Payer: Managed Care, Other (non HMO) | Source: Ambulatory Visit | Attending: Family Medicine | Admitting: Family Medicine

## 2018-02-21 ENCOUNTER — Ambulatory Visit: Payer: Managed Care, Other (non HMO) | Admitting: Family Medicine

## 2018-02-21 ENCOUNTER — Encounter: Payer: Self-pay | Admitting: Family Medicine

## 2018-02-21 VITALS — BP 117/77 | HR 88 | Resp 16 | Ht 65.0 in | Wt 195.0 lb

## 2018-02-21 DIAGNOSIS — Z23 Encounter for immunization: Secondary | ICD-10-CM

## 2018-02-21 DIAGNOSIS — X58XXXA Exposure to other specified factors, initial encounter: Secondary | ICD-10-CM | POA: Diagnosis not present

## 2018-02-21 DIAGNOSIS — S6722XA Crushing injury of left hand, initial encounter: Secondary | ICD-10-CM | POA: Insufficient documentation

## 2018-02-21 MED ORDER — TRAMADOL HCL 50 MG PO TABS: 50.0000 mg | ORAL_TABLET | Freq: Three times a day (TID) | ORAL | 0 refills | Status: DC | PRN

## 2018-02-21 NOTE — Progress Notes (Signed)
Date:  02/21/2018   Name:  Tamara [name]   DOB:  August 01, 1965   MRN:  [id number]   Chief Complaint: Hand Pain (Left hand SECOND Digit injury from hydrolic jack while moving camper on Saturday PM . Seen at Urgent Care in [place] on Saturday and brace placed but swelling was severe and blocked any views on xray. ) Hand Pain   The incident occurred 2 days ago. [place] Injury mechanism: crush injury. The quality of the pain is described as aching. The pain is at a severity of 5/10. The pain is moderate. The pain has been improving since the incident. Associated symptoms include tingling. Pertinent negatives include no muscle weakness or numbness. The symptoms are aggravated by movement and palpation. She has tried elevation, ice and NSAIDs for the symptoms. The treatment provided moderate relief.     Review of Systems  Constitutional: Negative.  Negative for chills, fatigue, fever and unexpected weight change.  HENT: Negative for congestion, ear discharge, ear pain, rhinorrhea, sinus pressure, sneezing and sore throat.   Eyes: Negative for photophobia, pain, discharge, redness and itching.  Respiratory: Negative for cough, shortness of breath, wheezing and stridor.   Gastrointestinal: Negative for abdominal pain, blood in stool, constipation, diarrhea, nausea and vomiting.  Endocrine: Negative for cold intolerance, heat intolerance, polydipsia, polyphagia and polyuria.  Genitourinary: Negative for dysuria, flank pain, frequency, hematuria, menstrual problem, pelvic pain, urgency, vaginal bleeding and vaginal discharge.  Musculoskeletal: Negative for arthralgias, back pain and myalgias.  Skin: Negative for rash.  Allergic/Immunologic: Negative for environmental allergies and food allergies.  Neurological: Positive for tingling. Negative for dizziness, weakness, light-headedness, numbness and headaches.  Hematological: Negative for adenopathy. Does not bruise/bleed easily.   Psychiatric/Behavioral: Negative for dysphoric mood. The patient is not nervous/anxious.     Patient Active Problem List   Diagnosis Date Noted  . Primary osteoarthritis of right knee 10/01/2015    No Known Allergies  Past Surgical History:  Procedure Laterality Date  . CESAREAN SECTION    . Left Knee Arthroscopy      Social History   Tobacco Use  . Smoking status: Never Smoker  . Smokeless tobacco: Never Used  Substance Use Topics  . Alcohol use: No    Frequency: Never  . Drug use: No     Medication list has been reviewed and updated.  Current Meds  Medication Sig  . acetaminophen (TYLENOL) 325 MG tablet Take 650 mg by mouth every 6 (six) hours as needed.  . meloxicam (MOBIC) 7.5 MG tablet Take 7.5 mg by mouth daily.  . [DISCONTINUED] meloxicam (MOBIC) 15 MG tablet Take 15 mg by mouth daily.  . [DISCONTINUED] norethindrone-ethinyl estradiol (FEMHRT 1/5) 1-5 MG-MCG TABS tablet Take 1 tablet by mouth daily.    PHQ 2/9 Scores 11/10/2017 07/05/2017  PHQ - 2 Score 0 0  PHQ- 9 Score 0 -    Physical Exam  Musculoskeletal:       Left hand: She exhibits decreased range of motion, tenderness, bony tenderness, deformity and swelling. Decreased sensation noted. Decreased sensation is present in the medial distribution.  Tender left index distal phalanx and dip/mp, and second metacarpal.  Nursing note and vitals reviewed.   BP 117/77   Pulse 88   Resp 16   Ht 5\' 5"  (1.651 m)   Wt 195 lb (88.5 kg)   SpO2 98%   BMI 32.45 kg/m   Assessment and Plan:  1. Crush injury of hand, left, initial encounter  Order xray of hand/ prescribe Tramadol as needed for pain - DG Hand Complete Left; Future - traMADol (ULTRAM) 50 MG tablet; Take 1 tablet (50 mg total) by mouth every 8 (eight) hours as needed.  Dispense: 15 tablet; Refill: 0  2. Flu vaccine need administered - Flu Vaccine QUAD 6+ mos PF IM (Fluarix Quad PF)  Dr. Elizabeth Sauer [facility]  02/21/2018

## 2018-03-28 ENCOUNTER — Encounter: Payer: Self-pay | Admitting: Family Medicine

## 2018-03-28 ENCOUNTER — Ambulatory Visit: Payer: Managed Care, Other (non HMO) | Admitting: Family Medicine

## 2018-03-28 VITALS — BP 120/64 | HR 76 | Ht 65.0 in | Wt 198.0 lb

## 2018-03-28 DIAGNOSIS — J0100 Acute maxillary sinusitis, unspecified: Secondary | ICD-10-CM

## 2018-03-28 DIAGNOSIS — H68001 Unspecified Eustachian salpingitis, right ear: Secondary | ICD-10-CM | POA: Diagnosis not present

## 2018-03-28 MED ORDER — AMOXICILLIN 500 MG PO CAPS: 500.0000 mg | ORAL_CAPSULE | Freq: Three times a day (TID) | ORAL | 0 refills | Status: DC

## 2018-03-28 NOTE — Progress Notes (Signed)
Date:  03/28/2018   Name:  Tamara [name]   DOB:  1966/02/18   MRN:  [id number]   Chief Complaint: Ear Pain (R) ear has "hurt all night long"- started with a sore throat) Otalgia   There is pain in the right ear. This is a new problem. The current episode started yesterday. The problem occurs constantly. The problem has been gradually worsening. There has been no fever. The pain is at a severity of 5/10. The pain is moderate. Associated symptoms include a sore throat. Pertinent negatives include no abdominal pain, coughing, diarrhea, ear discharge, headaches, hearing loss, neck pain or rash. She has tried nothing (Q tip) for the symptoms. The treatment provided mild relief.  Sinusitis  This is a new problem. The current episode started in the past 7 days. The problem has been gradually worsening since onset. There has been no fever. The pain is mild. Associated symptoms include congestion, ear pain, sinus pressure and a sore throat. Pertinent negatives include no chills, coughing, diaphoresis, headaches, hoarse voice, neck pain, shortness of breath, sneezing or swollen glands.     Review of Systems  Constitutional: Negative for chills, diaphoresis and fever.  HENT: Positive for congestion, ear pain, sinus pressure and sore throat. Negative for drooling, ear discharge, hearing loss, hoarse voice and sneezing.   Respiratory: Negative for cough, shortness of breath and wheezing.   Cardiovascular: Negative for chest pain, palpitations and leg swelling.  Gastrointestinal: Negative for abdominal pain, blood in stool, constipation, diarrhea and nausea.  Endocrine: Negative for polydipsia.  Genitourinary: Negative for dysuria, frequency, hematuria and urgency.  Musculoskeletal: Negative for back pain, myalgias and neck pain.  Skin: Negative for rash.  Allergic/Immunologic: Negative for environmental allergies.  Neurological: Negative for dizziness and headaches.  Hematological: Does not bruise/bleed  easily.  Psychiatric/Behavioral: Negative for suicidal ideas. The patient is not nervous/anxious.     Patient Active Problem List   Diagnosis Date Noted  . Primary osteoarthritis of right knee 10/01/2015    No Known Allergies  Past Surgical History:  Procedure Laterality Date  . CESAREAN SECTION    . Left Knee Arthroscopy      Social History   Tobacco Use  . Smoking status: Never Smoker  . Smokeless tobacco: Never Used  Substance Use Topics  . Alcohol use: No    Frequency: Never  . Drug use: No     Medication list has been reviewed and updated.  Current Meds  Medication Sig  . acetaminophen (TYLENOL) 325 MG tablet Take 650 mg by mouth every 6 (six) hours as needed.  . meloxicam (MOBIC) 7.5 MG tablet Take 7.5 mg by mouth daily.    PHQ 2/9 Scores 11/10/2017 07/05/2017  PHQ - 2 Score 0 0  PHQ- 9 Score 0 -    Physical Exam  Constitutional: No distress.  HENT:  Head: Normocephalic and atraumatic.  Right Ear: Hearing, tympanic membrane, external ear and ear canal normal.  Left Ear: Hearing, tympanic membrane, external ear and ear canal normal.  Nose: Right sinus exhibits maxillary sinus tenderness. Right sinus exhibits no frontal sinus tenderness. Left sinus exhibits maxillary sinus tenderness. Left sinus exhibits no frontal sinus tenderness.  Mouth/Throat: Uvula is midline and oropharynx is clear and moist.  Eyes: Pupils are equal, round, and reactive to light. Conjunctivae and EOM are normal. Right eye exhibits no discharge. Left eye exhibits no discharge.  Neck: Normal range of motion. Neck supple. No JVD present. No thyromegaly present.  Cardiovascular: Normal rate,  regular rhythm, normal heart sounds and intact distal pulses. Exam reveals no gallop and no friction rub.  No murmur heard. Pulmonary/Chest: Effort normal and breath sounds normal.  Abdominal: Soft. Bowel sounds are normal. She exhibits no mass. There is no tenderness. There is no guarding.    Musculoskeletal: Normal range of motion. She exhibits no edema.  Lymphadenopathy:       Head (right side): Submandibular adenopathy present.       Head (left side): Submandibular adenopathy present.    She has cervical adenopathy.       Right cervical: Superficial cervical adenopathy present.       Left cervical: Superficial cervical adenopathy present.  Neurological: She is alert. She has normal reflexes.  Skin: Skin is warm and dry. She is not diaphoretic.  Nursing note and vitals reviewed.   BP 120/64   Pulse 76   Ht 5\' 5"  (1.651 m)   Wt 198 lb (89.8 kg)   BMI 32.95 kg/m   Assessment and Plan:  1. Acute maxillary sinusitis, recurrence not specified Acute.  Initiate amoxicillin 500 mg 3 times daily for 10 days.  2. Catarrh of right eustachian tube Acute.  Suggested use of Sudafed and Flonase.  Dr. Hayden [facility]  03/28/2018

## 2018-04-13 ENCOUNTER — Ambulatory Visit: Payer: Managed Care, Other (non HMO) | Admitting: Gastroenterology

## 2018-05-09 ENCOUNTER — Ambulatory Visit: Payer: Managed Care, Other (non HMO) | Admitting: Gastroenterology

## 2018-05-11 HISTORY — PX: ESOPHAGOGASTRODUODENOSCOPY: SHX1529

## 2018-05-18 ENCOUNTER — Ambulatory Visit: Payer: Managed Care, Other (non HMO) | Admitting: Family Medicine

## 2018-05-19 ENCOUNTER — Encounter: Payer: Self-pay | Admitting: Family Medicine

## 2018-05-19 ENCOUNTER — Ambulatory Visit: Payer: Managed Care, Other (non HMO) | Admitting: Family Medicine

## 2018-05-19 VITALS — BP 130/80 | HR 72 | Ht 65.0 in | Wt 202.0 lb

## 2018-05-19 DIAGNOSIS — R05 Cough: Secondary | ICD-10-CM | POA: Diagnosis not present

## 2018-05-19 DIAGNOSIS — J0100 Acute maxillary sinusitis, unspecified: Secondary | ICD-10-CM | POA: Diagnosis not present

## 2018-05-19 DIAGNOSIS — R059 Cough, unspecified: Secondary | ICD-10-CM

## 2018-05-19 MED ORDER — GUAIFENESIN-CODEINE 100-10 MG/5ML PO SYRP: 5.0000 mL | ORAL_SOLUTION | Freq: Three times a day (TID) | ORAL | 0 refills | Status: DC | PRN

## 2018-05-19 MED ORDER — AMOXICILLIN-POT CLAVULANATE 875-125 MG PO TABS: 1.0000 | ORAL_TABLET | Freq: Two times a day (BID) | ORAL | 0 refills | Status: DC

## 2018-05-19 NOTE — Progress Notes (Signed)
Date:  05/19/2018   Name:  Tamara [name]   DOB:  [birth year]   MRN:  [id number]   Chief Complaint: Cough (x 2 weeks- Went to ER Sunday- normal chest xray. Bloody/ green production)  Cough  This is a new problem. The current episode started 1 to 4 weeks ago (10 days). The problem has been unchanged. The cough is productive of blood-tinged sputum and productive of purulent sputum. Pertinent negatives include no chest pain, chills, ear congestion, ear pain, eye redness, fever, headaches, heartburn, hemoptysis, myalgias, nasal congestion, postnasal drip, rash, rhinorrhea, sore throat, shortness of breath, sweats, weight loss or wheezing. She has tried prescription cough suppressant for the symptoms. The treatment provided no relief. There is no history of bronchiectasis, bronchitis or environmental allergies.  Sinusitis  This is a recurrent problem. There has been no fever. The pain is moderate. Associated symptoms include coughing and sinus pressure. Pertinent negatives include no chills, congestion, ear pain, headaches, hoarse voice, neck pain, shortness of breath, sneezing or sore throat.    Review of Systems  Constitutional: Negative.  Negative for chills, fatigue, fever, unexpected weight change and weight loss.  HENT: Positive for sinus pressure. Negative for congestion, ear discharge, ear pain, hoarse voice, postnasal drip, rhinorrhea, sneezing and sore throat.   Eyes: Negative for photophobia, pain, discharge, redness and itching.  Respiratory: Positive for cough. Negative for hemoptysis, shortness of breath, wheezing and stridor.   Cardiovascular: Negative for chest pain.  Gastrointestinal: Negative for abdominal pain, blood in stool, constipation, diarrhea, heartburn, nausea and vomiting.  Endocrine: Negative for cold intolerance, heat intolerance, polydipsia, polyphagia and polyuria.  Genitourinary: Negative for dysuria, flank pain, frequency, hematuria, menstrual problem, pelvic pain,  urgency, vaginal bleeding and vaginal discharge.  Musculoskeletal: Negative for arthralgias, back pain, myalgias and neck pain.  Skin: Negative for rash.  Allergic/Immunologic: Negative for environmental allergies and food allergies.  Neurological: Negative for dizziness, weakness, light-headedness, numbness and headaches.  Hematological: Negative for adenopathy. Does not bruise/bleed easily.  Psychiatric/Behavioral: Negative for dysphoric mood. The patient is not nervous/anxious.     Patient Active Problem List   Diagnosis Date Noted  . Primary osteoarthritis of right knee 10/01/2015    No Known Allergies  Past Surgical History:  Procedure Laterality Date  . CESAREAN SECTION    . Left Knee Arthroscopy      Social History   Tobacco Use  . Smoking status: Never Smoker  . Smokeless tobacco: Never Used  Substance Use Topics  . Alcohol use: No    Frequency: Never  . Drug use: No     Medication list has been reviewed and updated.  Current Meds  Medication Sig  . acetaminophen (TYLENOL) 325 MG tablet Take 650 mg by mouth every 6 (six) hours as needed.  . meloxicam (MOBIC) 7.5 MG tablet Take 7.5 mg by mouth as needed.     PHQ 2/9 Scores 11/10/2017 07/05/2017  PHQ - 2 Score 0 0  PHQ- 9 Score 0 -    Physical Exam Vitals signs reviewed.  Constitutional:      General: She is not in acute distress.    Appearance: She is not diaphoretic.  HENT:     Head: Normocephalic and atraumatic.     Right Ear: Tympanic membrane, ear canal and external ear normal.     Left Ear: Tympanic membrane, ear canal and external ear normal.     Nose: Congestion present.     Right Sinus: No maxillary sinus tenderness or frontal  sinus tenderness.     Left Sinus: No maxillary sinus tenderness or frontal sinus tenderness.     Mouth/Throat:     Mouth: Mucous membranes are moist.     Pharynx: Posterior oropharyngeal erythema present. No oropharyngeal exudate.  Eyes:     General:        Right eye:  No discharge.        Left eye: No discharge.     Conjunctiva/sclera: Conjunctivae normal.     Pupils: Pupils are equal, round, and reactive to light.  Neck:     Musculoskeletal: Normal range of motion and neck supple.     Thyroid: No thyromegaly.     Vascular: No JVD.  Cardiovascular:     Rate and Rhythm: Normal rate and regular rhythm.     Heart sounds: Normal heart sounds. No murmur. No friction rub. No gallop.   Pulmonary:     Effort: Pulmonary effort is normal.     Breath sounds: Normal breath sounds. No decreased breath sounds, wheezing, rhonchi or rales.  Abdominal:     General: Bowel sounds are normal.     Palpations: Abdomen is soft. There is no mass.     Tenderness: There is no abdominal tenderness. There is no guarding.  Musculoskeletal: Normal range of motion.  Lymphadenopathy:     Cervical: No cervical adenopathy.  Skin:    General: Skin is warm and dry.  Neurological:     Mental Status: She is alert.     Deep Tendon Reflexes: Reflexes are normal and symmetric.     BP 130/80   Pulse 72   Ht 5\' 5"  (1.651 m)   Wt 202 lb (91.6 kg)   BMI 33.61 kg/m   Assessment and Plan: 1. Acute maxillary sinusitis, recurrence not specified Acute.  Persistent.  Has not improved on suppression and just medication.  Will initiate Augmentin 875 mg twice a day. - amoxicillin-clavulanate (AUGMENTIN) 875-125 MG tablet; Take 1 tablet by mouth 2 (two) times daily.  Dispense: 20 tablet; Refill: 0  2. Cough   Will refill Robitussin-AC 1 teaspoon every 6 hours for cough - guaiFENesin-codeine (ROBITUSSIN AC) 100-10 MG/5ML syrup; Take 5 mLs by mouth 3 (three) times daily as needed for cough.  Dispense: 100 mL; Refill: 0

## 2018-06-08 ENCOUNTER — Emergency Department
Admission: EM | Admit: 2018-06-08 | Discharge: 2018-06-08 | Disposition: A | Discharge status: Home / Self Care | Payer: Managed Care, Other (non HMO) | Attending: Emergency Medicine | Admitting: Emergency Medicine

## 2018-06-08 ENCOUNTER — Encounter: Payer: Self-pay | Admitting: Emergency Medicine

## 2018-06-08 ENCOUNTER — Emergency Department: Payer: Managed Care, Other (non HMO)

## 2018-06-08 ENCOUNTER — Other Ambulatory Visit: Payer: Self-pay

## 2018-06-08 DIAGNOSIS — K219 Gastro-esophageal reflux disease without esophagitis: Secondary | ICD-10-CM | POA: Diagnosis not present

## 2018-06-08 DIAGNOSIS — Z79899 Other long term (current) drug therapy: Secondary | ICD-10-CM | POA: Insufficient documentation

## 2018-06-08 DIAGNOSIS — R079 Chest pain, unspecified: Secondary | ICD-10-CM | POA: Diagnosis present

## 2018-06-08 DIAGNOSIS — R109 Unspecified abdominal pain: Secondary | ICD-10-CM | POA: Diagnosis not present

## 2018-06-08 DIAGNOSIS — R0789 Other chest pain: Secondary | ICD-10-CM | POA: Insufficient documentation

## 2018-06-08 LAB — CBC
HCT: 40.7 % (ref 36.0–46.0)
Hemoglobin: 13.1 g/dL (ref 12.0–15.0)
MCH: 28.1 pg (ref 26.0–34.0)
MCHC: 32.2 g/dL (ref 30.0–36.0)
MCV: 87.2 fL (ref 80.0–100.0)
Platelets: 300 10*3/uL (ref 150–400)
RBC: 4.67 MIL/uL (ref 3.87–5.11)
RDW: 13.1 % (ref 11.5–15.5)
WBC: 4.7 10*3/uL (ref 4.0–10.5)
nRBC: 0 % (ref 0.0–0.2)

## 2018-06-08 LAB — BASIC METABOLIC PANEL
Anion gap: 8 (ref 5–15)
BUN: 13 mg/dL (ref 6–20)
CO2: 26 mmol/L (ref 22–32)
Calcium: 9.3 mg/dL (ref 8.9–10.3)
Chloride: 104 mmol/L (ref 98–111)
Creatinine, Ser: 0.7 mg/dL (ref 0.44–1.00)
GFR calc Af Amer: >60 mL/min (ref >60)
GFR calc non Af Amer: >60 mL/min (ref >60)
GLUCOSE: 102 mg/dL — AB (ref 70–99)
Potassium: 4 mmol/L (ref 3.5–5.1)
Sodium: 138 mmol/L (ref 135–145)

## 2018-06-08 LAB — TROPONIN I
Troponin I: <0.03 ng/mL (ref <0.03)
Troponin I: <0.03 ng/mL (ref <0.03)

## 2018-06-08 LAB — HEPATIC FUNCTION PANEL
ALT: 50 U/L — ABNORMAL HIGH (ref 0–44)
AST: 39 U/L (ref 15–41)
Albumin: 4.3 g/dL (ref 3.5–5.0)
Alkaline Phosphatase: 63 U/L (ref 38–126)
Bilirubin, Direct: <0.1 mg/dL (ref 0.0–0.2)
Total Bilirubin: 0.7 mg/dL (ref 0.3–1.2)
Total Protein: 8.1 g/dL (ref 6.5–8.1)

## 2018-06-08 LAB — LIPASE, BLOOD: Lipase: 23 U/L (ref 11–51)

## 2018-06-08 MED ORDER — SODIUM CHLORIDE 0.9% FLUSH
3.0000 mL | Freq: Once | INTRAVENOUS | Status: AC
  Administered 2018-06-08: 3 mL via INTRAVENOUS

## 2018-06-08 MED ORDER — FAMOTIDINE 20 MG PO TABS: 20.0000 mg | ORAL_TABLET | Freq: Two times a day (BID) | ORAL | 1 refills | Status: DC

## 2018-06-08 NOTE — Discharge Instructions (Signed)
He would prefer not to be admitted to the hospital, which is not unreasonable but it does mean that we would like you to be vigilant about your health.  If you have recurrent pain, change in your chronic pain especially, shortness of breath or you feel worse in any way, return to the emergency room.  Please otherwise follow-up for another stress test with cardiology and most importantly GI medicine.  Take the antiacid as prescribed every day.  Avoid spicy foods and please follow the instructions we have given you.

## 2018-06-08 NOTE — ED Triage Notes (Signed)
Pt reports Cp since this am at 3. Pt states it feels like a knife stabbing her from the back to the front.

## 2018-06-08 NOTE — ED Triage Notes (Signed)
PT c/o CP that woke her up this am around 0300, pain radiating into jaw. Pt describes pain as tightness. NAD noted, VSS

## 2018-06-08 NOTE — ED Provider Notes (Addendum)
[facility] Emergency Department Provider Note  ____________________________________________   I have reviewed the triage vital signs and the nursing notes. Where available I have reviewed prior notes and, if possible and indicated, outside hospital notes.    HISTORY  Chief Complaint Chest Pain    HPI Tamara [name] is a 53 y.o. female with a history of fatty liver, otherwise healthy, states that for the last year she is having off-and-on epigastric and sometimes right upper quadrant abdominal pain that radiates intermittently to her chest.  She states that it is a burning sensation.  Sometimes she feels it up in her throat and jaw area.  She states that she has seen medical care for this in the past.  She went to [facility] about a month ago, and reported that she was having these frequent events.  They did an ultrasound which showed fatty liver but no gallstones, and she states she was advised that it was likely reflux disease.  Patient has been taking over-the-counter antiacid's, but only on a as needed basis.  Last night she woke up with epigastric discomfort that went up her esophagus towards the back of her throat.  There was no shortness of breath.  It lasted for a while and then went away.  She states that when she lay down flat it was worse but when she sat up it felt better.  She states that this is the same pain that she usually has, although its intensity may been somewhat more significant.  She denies any melena or bright red blood per rectum.  She has no hemoptysis.  She has no hematemesis.  She has no exertional symptoms she denies dyspnea or any pleural complaint.  Times the pain seems to radiate a little bit towards her back.  That happened this morning and has had multiple times before.  At this time she is pain-free unless she touches her stomach the wrong way.  She states that she did have some spicy food for dinner last night and did not take any antiacid's.  She has  been pain-free she states for almost the entire day since waking up. No other alleviating or aggravating factors no other prior treatment no other characteristics noted.   History reviewed. No pertinent past medical history.  Patient Active Problem List   Diagnosis Date Noted  . Primary osteoarthritis of right knee 10/01/2015    Past Surgical History:  Procedure Laterality Date  . CESAREAN SECTION    . Left Knee Arthroscopy      Prior to Admission medications   Medication Sig Start Date End Date Taking? Authorizing Provider  acetaminophen (TYLENOL) 325 MG tablet Take 650 mg by mouth every 6 (six) hours as needed.    [provider]  amoxicillin-clavulanate (AUGMENTIN) 875-125 MG tablet Take 1 tablet by mouth 2 (two) times daily. 05/19/18   Duanne Limerick, MD  guaiFENesin-codeine (ROBITUSSIN AC) 100-10 MG/5ML syrup Take 5 mLs by mouth 3 (three) times daily as needed for cough. 05/19/18   Duanne Limerick, MD  meloxicam (MOBIC) 7.5 MG tablet Take 7.5 mg by mouth as needed.     [provider]    Allergies Patient has no known allergies.  Family History  Problem Relation Age of Onset  . Cancer Mother   . Cancer Father   . Breast cancer Neg Hx     Social History Social History   Tobacco Use  . Smoking status: Never Smoker  . Smokeless tobacco: Never Used  Substance Use Topics  . Alcohol use: No    Frequency: Never  . Drug use: No    Review of Systems Constitutional: No fever/chills Eyes: No visual changes. ENT: No sore throat. No stiff neck no neck pain Cardiovascular: The HPI regarding chest pain. Respiratory: Denies shortness of breath. Gastrointestinal:   no vomiting.  No diarrhea.  No constipation. Genitourinary: Negative for dysuria. Musculoskeletal: Negative lower extremity swelling Skin: Negative for rash. Neurological: Negative for severe headaches, focal weakness or numbness.   ____________________________________________   PHYSICAL  EXAM:  VITAL SIGNS: ED Triage Vitals  Enc Vitals Group     BP 06/08/18 0914 (!) 150/72     Pulse Rate 06/08/18 0914 91     Resp 06/08/18 0914 16     Temp 06/08/18 0914 97.8 F (36.6 C)     Temp Source 06/08/18 0914 Oral     SpO2 06/08/18 0914 100 %     Weight --      Height --      Head Circumference --      Peak Flow --      Pain Score 06/08/18 0915 0     Pain Loc --      Pain Edu? --      Excl. in GC? --     Constitutional: Alert and oriented. Well appearing and in no acute distress.  She is sitting with legs crossed, no acute distress smiling and appears quite well Eyes: Conjunctivae are normal Head: Atraumatic HEENT: No congestion/rhinnorhea. Mucous membranes are moist.  Oropharynx non-erythematous Neck:   Nontender with no meningismus, no masses, no stridor Cardiovascular: Normal rate, regular rhythm. Grossly normal heart sounds.  Good peripheral circulation. Respiratory: Normal respiratory effort.  No retractions. Lungs CTAB. Abdominal: Soft and minimal epigastric discomfort, this does reproduce her pain.  No other focal tenderness noted, no guarding no rebound benign abdomen. No distention. No guarding no rebound Back:  There is no focal tenderness or step off.  there is no midline tenderness there are no lesions noted. there is no CVA tenderness Musculoskeletal: No lower extremity tenderness, no upper extremity tenderness. No joint effusions, no DVT signs strong distal pulses no edema Neurologic:  Normal speech and language. No gross focal neurologic deficits are appreciated.  Skin:  Skin is warm, dry and intact. No rash noted. Psychiatric: Mood and affect are normal. Speech and behavior are normal.  ____________________________________________   LABS (all labs ordered are listed, but only abnormal results are displayed)  Labs Reviewed  BASIC METABOLIC PANEL - Abnormal; Notable for the following components:      Result Value   Glucose, Bld 102 (*)    All other  components within normal limits  CBC  TROPONIN I  TROPONIN I  LIPASE, BLOOD  HEPATIC FUNCTION PANEL    Pertinent labs  results that were available during my care of the patient were reviewed by me and considered in my medical decision making (see chart for details). ____________________________________________  EKG  I personally interpreted any EKGs ordered by me or triage Sinus rhythm rate 85 bpm, no acute ST elevation or depression, RSR prime configuration appreciated, no ____________________________________________  RADIOLOGY  Pertinent labs & imaging results that were available during my care of the patient were reviewed by me and considered in my medical decision making (see chart for details). If possible, patient and/or family made aware of any abnormal findings.  Dg Chest 2 View  Result Date: 06/08/2018 CLINICAL DATA:  Chest pain. EXAM: CHEST -  2 VIEW COMPARISON:  07/17/2011 FINDINGS: The heart size and mediastinal contours are within normal limits. Both lungs are clear. The visualized skeletal structures are unremarkable. IMPRESSION: Normal exam. Electronically Signed   By: Francene BoyersJames  Maxwell M.D.   On: 06/08/2018 09:50   ____________________________________________    PROCEDURES  Procedure(s) performed: None  Procedures  Critical Care performed: None  ____________________________________________   INITIAL IMPRESSION / ASSESSMENT AND PLAN / ED COURSE  Pertinent labs & imaging results that were available during my care of the patient were reviewed by me and considered in my medical decision making (see chart for details).  Patient here for positional epigastric abdominal pain that goes up towards her esophagus and a burning manner which is worse with certain foods has been intermittently present for 1 year, worse when she lies flat, better when she takes antiacid's, with a negative ultrasound previously and no significant risk factors for CAD.  There is some reducible  discomfort to her epigastric region, she has been now at least 9 hours since the onset of symptoms and she is no evidence of elevated troponin or other significant markers for likely ongoing ischemia.  I do not think this represents a PE, patient has no pleuritic component to this whatsoever.  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  This is most consistent with either gallbladder disease or reflux disease but she [facility] of which I personally reviewed.  Given her exam I have low suspicion for the development of gallbladder stones in that short period of time.  There is no evidence that her reflux disease has resulted in a perforation, or any rectal or internal bleeding fortunately.  Her exam is quite benign I do not think she has retroperitoneal or intra-abdominal surgical emergency at this time.  I will however check liver function test, she states she has followed up with a liver specialist for her fatty liver, I will also obtain a lipase, and we will recheck troponin as a precaution only given her age.  ----------------------------------------- 2:12 PM on 06/08/2018 -----------------------------------------  Patient has a benign abdomen negative troponins x2- lipase reassuring liver function test, we talked about admission to the hospital her strong preference is to go home.  She states she has had stress test for this exact symptom 3 to 4 years ago, she does not want to stay in the hospital she understands the risks benefits and alternatives to going home.  We did have our customary conversation.  I have therefore arranged for her to take antacids on a daily basis, and I have provided her with close follow-up and instructed her how to follow-up with GI medicine and cardiology.  Cardiology as much for her peace of mind is  anything is at this time there is no real indication that she is having ACS nonetheless I do think it is be in her best interest to follow-up with them as well.  Extensive return precautions given and understood.    ____________________________________________   FINAL CLINICAL IMPRESSION(S) / ED DIAGNOSES  Final diagnoses:  None      This chart was dictated using voice recognition software.  Despite best efforts to proofread,  errors can occur which can change meaning.      Jeanmarie PlantMcShane, Ankur Snowdon A, MD 06/08/18 1217    Jeanmarie PlantMcShane, Keygan Dumond A, MD 06/08/18 [phone]

## 2018-06-09 DIAGNOSIS — R748 Abnormal levels of other serum enzymes: Secondary | ICD-10-CM | POA: Insufficient documentation

## 2018-06-09 DIAGNOSIS — R1013 Epigastric pain: Secondary | ICD-10-CM | POA: Insufficient documentation

## 2018-06-09 DIAGNOSIS — R0789 Other chest pain: Secondary | ICD-10-CM | POA: Insufficient documentation

## 2018-06-09 DIAGNOSIS — R0989 Other specified symptoms and signs involving the circulatory and respiratory systems: Secondary | ICD-10-CM | POA: Insufficient documentation

## 2018-06-09 DIAGNOSIS — K760 Fatty (change of) liver, not elsewhere classified: Secondary | ICD-10-CM | POA: Insufficient documentation

## 2018-06-13 HISTORY — PX: ESOPHAGOGASTRODUODENOSCOPY: SHX1529

## 2018-07-05 ENCOUNTER — Other Ambulatory Visit: Payer: Self-pay | Admitting: Gastroenterology

## 2018-07-05 DIAGNOSIS — R110 Nausea: Secondary | ICD-10-CM

## 2018-07-05 DIAGNOSIS — R1011 Right upper quadrant pain: Secondary | ICD-10-CM

## 2018-07-05 DIAGNOSIS — R112 Nausea with vomiting, unspecified: Secondary | ICD-10-CM

## 2018-07-12 ENCOUNTER — Other Ambulatory Visit: Payer: Self-pay

## 2018-07-12 ENCOUNTER — Ambulatory Visit
Admission: RE | Admit: 2018-07-12 | Discharge: 2018-07-12 | Disposition: A | Discharge status: Home / Self Care | Payer: Managed Care, Other (non HMO) | Source: Ambulatory Visit | Attending: Gastroenterology | Admitting: Gastroenterology

## 2018-07-12 DIAGNOSIS — R112 Nausea with vomiting, unspecified: Secondary | ICD-10-CM | POA: Insufficient documentation

## 2018-07-12 DIAGNOSIS — R110 Nausea: Secondary | ICD-10-CM | POA: Diagnosis present

## 2018-07-12 DIAGNOSIS — R1011 Right upper quadrant pain: Secondary | ICD-10-CM | POA: Diagnosis present

## 2018-07-12 MED ORDER — TECHNETIUM TC 99M MEBROFENIN IV KIT
5.0000 | PACK | Freq: Once | INTRAVENOUS | Status: AC | PRN
  Administered 2018-07-12: 4.936 via INTRAVENOUS

## 2018-07-26 ENCOUNTER — Ambulatory Visit: Payer: Self-pay | Admitting: Surgery

## 2018-07-26 NOTE — H&P (View-Only) (Signed)
Subjective:   CC: Biliary colic [K80.50]  HPI:  Tamara [name] is a 52 y.o. female who was referred by Granville Mason Croley,* for evaluation of above CC. Initiual Symptoms were first noted 2 years ago. Pain is sharp and intermittent, confined to the right upper quadrant, radiating towards back.  It was episodic with no specific associations until about six weeks ago, when she had acute exacerbation and was seen in ED.  Workup was negative, but since then, she has had persistent episodes after every meal, along with increased loose BMs, 3-4x a day.  Pain is still same location and radiation to back.    Past Medical History:  has a past medical history of Diverticulosis (11/14/2014), External hemorrhoids (11/14/2014), Fatty liver, GERD (gastroesophageal reflux disease), and Internal hemorrhoids (11/14/2014).  Past Surgical History:  has a past surgical history that includes Endometrial ablation; Cesarean section; Dilation and curettage of uterus; Umbilical hernia repair; Knee arthroscopy; Colonoscopy (11/14/2014); and egd (06/13/2018).  Family History: family history includes Bone cancer in her father and mother; Cervical cancer (age of onset: 59) in her sister; Colon polyps in her mother; High blood pressure (Hypertension) in her mother, paternal grandfather, and sister; Hyperlipidemia (Elevated cholesterol) in her sister and sister; Lung cancer in her father and mother; Other in her daughter; Psoriasis in her sister.  Social History:  reports that she has never smoked. She has never used smokeless tobacco. She reports that she does not drink alcohol or use drugs.  Current Medications: has a current medication list which includes the following prescription(s): hyoscyamine, meloxicam, and pantoprazole.  Allergies:     Allergies as of 07/22/2018  . (No Known Allergies)    ROS:  A 15 point review of systems was performed and pertinent positives and negatives noted in HPI    Objective:    BP 136/77   Pulse 70   Ht 165.1 cm (5' 5")   Wt 91.1 kg (200 lb 13.4 oz)   BMI 33.42 kg/m    Constitutional :  alert, appears stated age, cooperative and no distress  Lymphatics/Throat:  no asymmetry, masses, or scars  Respiratory:  clear to auscultation bilaterally  Cardiovascular:  regular rate and rhythm  Gastrointestinal: soft, no guarding, but tenderness in far lateral RUQ and epigastric region.    Musculoskeletal: Steady gait and movement  Skin: Cool and moist  Psychiatric: Normal affect, non-agitated, not confused       LABS:  n/a   RADS: CLINICAL DATA: Elevated LFTs  EXAM: US ABDOMEN LIMITED - RIGHT UPPER QUADRANT  ULTRASOUND HEPATIC ELASTOGRAPHY  TECHNIQUE: Limited right upper quadrant abdominal ultrasound was performed. In addition, ultrasound elastography evaluation of the liver was performed. A region of interest was placed in the right lobe of the liver. Following application of a compressive sonographic pulse, shear waves were detected in the adjacent hepatic tissue and the shear wave velocity was calculated. Multiple assessments were performed at the selected site. Median shear wave velocity is correlated to a Metavir fibrosis score.  COMPARISON: None.  FINDINGS: ULTRASOUND ABDOMEN LIMITED RIGHT UPPER QUADRANT  Gallbladder:  No gallstones, gallbladder wall thickening, or pericholecystic fluid. Negative sonographic Murphy's sign.  Common bile duct:  Diameter: 3 mm  Liver:  Coarse hepatic echotexture with hyperechoic hepatic parenchyma and a mildly nodular hepatic contour (image 33). No focal hepatic lesion is seen. Portal vein is patent on color Doppler imaging with normal direction of blood flow towards the liver.  ULTRASOUND HEPATIC ELASTOGRAPHY  Device: Siemens Helix VTQ    Patient position: Oblique  Transducer 4V1  Number of measurements: 10  Hepatic segment: 8  Median velocity: 1.09  m/sec  IQR: 0.06  IQR/Median velocity ratio: 0.06  Corresponding Metavir fibrosis score: F0/F1  Risk of fibrosis: Minimal  Limitations of exam: None  Please note that abnormal shear wave velocities may also be identified in clinical settings other than with hepatic fibrosis, such as: acute hepatitis, elevated right heart and central venous pressures including use of beta blockers, veno-occlusive disease (Budd-Chiari), infiltrative processes such as mastocytosis/amyloidosis/infiltrative tumor, extrahepatic cholestasis, in the post-prandial state, and liver transplantation. Correlation with patient history, laboratory data, and clinical condition recommended.  IMPRESSION: ULTRASOUND ABDOMEN: Coarse hepatic echotexture with hyperechoic hepatic parenchyma and a mildly nodular hepatic contour, suggesting hepatocellular disease such as early cirrhosis and/or hepatic steatosis.  No focal hepatic lesion is seen.  ULTRASOUND HEPATIC ELASTOGRAPHY:  Median hepatic shear wave velocity is calculated at 1.09 m/sec.  Corresponding Metavir fibrosis score is F0/F1.  Risk of fibrosis is Minimal.  Follow-up: None required   Electronically Signed By: Sriyesh Krishnan M.D. On: 01/11/2018 10:40  CLINICAL DATA: Intractable vomiting with nausea, and RIGHT upper quadrant pain, some diarrhea, prior umbilical hernia repair, history of fatty liver disease  EXAM: NUCLEAR MEDICINE HEPATOBILIARY IMAGING WITH GALLBLADDER EF  TECHNIQUE: Sequential images of the abdomen were obtained out to 60 minutes following intravenous administration of radiopharmaceutical. After oral ingestion of Ensure, gallbladder ejection fraction was determined. At 60 min, normal ejection fraction is greater than 33%.  RADIOPHARMACEUTICALS: 4.936 mCi Tc-99m Choletec IV  COMPARISON: None  FINDINGS: Normal tracer extraction from bloodstream indicating normal hepatocellular  function.  Normal excretion of tracer into biliary tree.  Gallbladder visualized at 42 min.  Small bowel visualized at 14 min.  No hepatic retention of tracer.  Subjectively normal emptying of tracer from gallbladder following fatty meal stimulation.  Calculated gallbladder ejection fraction is 70%, normal.  Patient reported increasing abdominal pain following Ensure ingestion.  Normal gallbladder ejection fraction following Ensure ingestion is greater than 33% at 1 hour.  IMPRESSION: Patent biliary tree.  Normal gallbladder ejection fraction of 70%.  Patient reported increasing abdominal pain following Ensure ingestion.   Electronically Signed  By: Mark Boles M.D.  On: 07/12/2018 18:19  Other Result Information  Interface, Rad Results In - 07/12/2018  6:21 PM EST CLINICAL DATA:  Intractable vomiting with nausea, and RIGHT upper quadrant pain, some diarrhea, prior umbilical hernia repair, history of fatty liver disease  EXAM: NUCLEAR MEDICINE HEPATOBILIARY IMAGING WITH GALLBLADDER EF  TECHNIQUE: Sequential images of the abdomen were obtained out to 60 minutes following intravenous administration of radiopharmaceutical. After oral ingestion of Ensure, gallbladder ejection fraction was determined. At 60 min, normal ejection fraction is greater than 33%.  RADIOPHARMACEUTICALS:  4.936 mCi Tc-99m  Choletec IV  COMPARISON:  None  FINDINGS: Normal tracer extraction from bloodstream indicating normal hepatocellular function.  Normal excretion of tracer into biliary tree.  Gallbladder visualized at 42 min.  Small bowel visualized at 14 min.  No hepatic retention of tracer.  Subjectively normal emptying of tracer from gallbladder following fatty meal stimulation.  Calculated gallbladder ejection fraction is 70%, normal.  Patient reported increasing abdominal pain following Ensure ingestion.  Normal gallbladder ejection fraction following Ensure  ingestion is greater than 33% at 1 hour.  IMPRESSION: Patent biliary tree.  Normal gallbladder ejection fraction of 70%.  Patient reported increasing abdominal pain following Ensure ingestion.   Electronically Signed   By: Mark  Boles M.D.   On: 07/12/2018   18:19   Result Impression  --Enlarged, echogenic liver which can be seen with fibrofatty disease. --No cholelithiasis or biliary ductal dilatation.  Please see below for data measurements: Liver: 17.8 cm  Gallbladder wall: 2.9 mm Sonographic Murphy's Sign: negative Pericholecystic fluid visualized: no  Common hepatic duct: 0.28 cm Proximal common bile duct: 0.37 cm Distal common bile duct: 0.61 cm  Right kidney length: 10.8 cm  Result Narrative  EXAM: US ABDOMEN LIMITED DATE: 05/16/2018 2:20 AM ACCESSION: [id number] DICTATED: 05/16/2018 2:26 AM INTERPRETATION LOCATION: [facility]  CLINICAL INDICATION: 52 years old Female with RUQ pain worse after eating intermittent x 1 mo, worse today. NPO since 530 pm   TECHNIQUE: Static and cine images of the right upper quadrant were performed.  COMPARISON: None  FINDINGS:   LIVER: The liver is mildly enlarged and echogenic. Heterogeneity of the liver limits evaluation for focal liver lesions. No focal hepatic lesions. No biliary ductal dilatation. Distal common bile duct is upper normal limits in size.  GALLBLADDER: The gallbladder is physiologically distended without internal stones or sludge. Sonographic Murphy sign is negative.  No pericholecystic fluid. No gallbladder wall thickening.  LIMITED RIGHT KIDNEY: Right pelviectasis without hydronephrosis.  Other Result Information  Interface, Rad Results In - 05/16/2018  7:32 AM EST EXAM: US ABDOMEN LIMITED DATE: 05/16/2018 2:20 AM ACCESSION: [id number] DICTATED: 05/16/2018 2:26 AM INTERPRETATION LOCATION: [facility]  CLINICAL INDICATION: 52 years old Female with RUQ pain worse after eating intermittent x 1 mo,  worse today.  NPO since 530 pm    TECHNIQUE: Static and cine images of the right upper quadrant were performed.  COMPARISON: None  FINDINGS:   LIVER: The liver is mildly enlarged and echogenic. Heterogeneity of the liver limits evaluation for focal liver lesions. No focal hepatic lesions. No biliary ductal dilatation. Distal common bile duct is upper normal limits in size.  GALLBLADDER: The gallbladder is physiologically distended without internal stones or sludge. Sonographic Murphy sign is negative.   No pericholecystic fluid. No gallbladder wall thickening.  LIMITED RIGHT KIDNEY: Right pelviectasis without hydronephrosis.  IMPRESSION: --Enlarged, echogenic liver which can be seen with fibrofatty disease. --No cholelithiasis or biliary ductal dilatation.  Please see below for data measurements: Liver: 17.8 cm  Gallbladder wall: 2.9 mm Sonographic Murphy's Sign: negative Pericholecystic fluid visualized: no  Common hepatic duct: 0.28 cm Proximal common bile duct: 0.37 cm Distal common bile duct: 0.61 cm  Right kidney length: 10.8 cm    Assessment:      Biliary colic [K80.50]  NASH Hx of umbilical hernia repair  Plan:   1. Biliary colic [K80.50] Discussed the risk of surgery including post-op infxn, seroma, biloma, chronic pain, poor-delayed wound healing, retained gallstone, conversion to open procedure, post-op SBO or ileus, and need for additional procedures to address said risks.  The risks of general anesthetic including MI, CVA, sudden death or even reaction to anesthetic medications also discussed. Alternatives include continued observation.  Benefits include possible symptom relief, prevention of complications including acute cholecystitis, pancreatitis.  Typical post operative recovery of 3-5 days rest, continued pain in area and incision sites, possible loose stools up to 4-6 weeks, also discussed.  ED return precautions given for sudden increase in RUQ  pain, with possible accompanying fever, nausea, and/or vomiting.  The patient understands the risks, any and all questions were answered to the patient's satisfaction.  2. We specifically discussed that with a negative US and HIDA, the liklihood that removing her GB will improve her current symptoms is   much lower, but if she still wishes to undergo procedure with risks described above, I cannot strongly advise against it.  If symptoms persist, we will at least be able to focus on other etiology.  She verbalized there are no guarantees and still wishes to proceed.  Will proceed with lap chole, repeat LFTs in am due to remote hx of slight elevation and possible liver disease noted on US.  Will also start with LUQ incision due to hx of umbilical hernia repair with mesh.  She is at increased intraoperative risk due to previous surgeries and possible liver disease.     Electronically signed by Scotty Pinder, DO on 07/22/2018 10:40 AM    

## 2018-07-26 NOTE — H&P (Signed)
Subjective:   CC: Biliary colic [K80.50]  HPI:  Tamara [name] is a 53 y.o. female who was referred by Otilio Miu,* for evaluation of above CC. Initiual Symptoms were first noted 2 years ago. Pain is sharp and intermittent, confined to the right upper quadrant, radiating towards back.  It was episodic with no specific associations until about six weeks ago, when she had acute exacerbation and was seen in ED.  Workup was negative, but since then, she has had persistent episodes after every meal, along with increased loose BMs, 3-4x a day.  Pain is still same location and radiation to back.    Past Medical History:  has a past medical history of Diverticulosis (11/14/2014), External hemorrhoids (11/14/2014), Fatty liver, GERD (gastroesophageal reflux disease), and Internal hemorrhoids (11/14/2014).  Past Surgical History:  has a past surgical history that includes Endometrial ablation; Cesarean section; Dilation and curettage of uterus; Umbilical hernia repair; Knee arthroscopy; Colonoscopy (11/14/2014); and egd (06/13/2018).  Family History: family history includes Bone cancer in her father and mother; Cervical cancer (age of onset: 107) in her sister; Colon polyps in her mother; High blood pressure (Hypertension) in her mother, paternal grandfather, and sister; Hyperlipidemia (Elevated cholesterol) in her sister and sister; Lung cancer in her father and mother; Other in her daughter; Psoriasis in her sister.  Social History:  reports that she has never smoked. She has never used smokeless tobacco. She reports that she does not drink alcohol or use drugs.  Current Medications: has a current medication list which includes the following prescription(s): hyoscyamine, meloxicam, and pantoprazole.  Allergies:     Allergies as of 07/22/2018  . (No Known Allergies)    ROS:  A 15 point review of systems was performed and pertinent positives and negatives noted in HPI    Objective:    BP 136/77   Pulse 70   Ht 165.1 cm ( )   Wt 91.1 kg (200 lb 13.4 oz)   BMI 33.42 kg/m    Constitutional :  alert, appears stated age, cooperative and no distress  Lymphatics/Throat:  no asymmetry, masses, or scars  Respiratory:  clear to auscultation bilaterally  Cardiovascular:  regular rate and rhythm  Gastrointestinal: soft, no guarding, but tenderness in far lateral RUQ and epigastric region.    Musculoskeletal: Steady gait and movement  Skin: Cool and moist  Psychiatric: Normal affect, non-agitated, not confused       LABS:  n/a   RADS: CLINICAL DATA: Elevated LFTs  EXAM: US ABDOMEN LIMITED - RIGHT UPPER QUADRANT  ULTRASOUND HEPATIC ELASTOGRAPHY  TECHNIQUE: Limited right upper quadrant abdominal ultrasound was performed. In addition, ultrasound elastography evaluation of the liver was performed. A region of interest was placed in the right lobe of the liver. Following application of a compressive sonographic pulse, shear waves were detected in the adjacent hepatic tissue and the shear wave velocity was calculated. Multiple assessments were performed at the selected site. Median shear wave velocity is correlated to a Metavir fibrosis score.  COMPARISON: None.  FINDINGS: ULTRASOUND ABDOMEN LIMITED RIGHT UPPER QUADRANT  Gallbladder:  No gallstones, gallbladder wall thickening, or pericholecystic fluid. Negative sonographic Murphy's sign.  Common bile duct:  Diameter: 3 mm  Liver:  Coarse hepatic echotexture with hyperechoic hepatic parenchyma and a mildly nodular hepatic contour (image 33). No focal hepatic lesion is seen. Portal vein is patent on color Doppler imaging with normal direction of blood flow towards the liver.  ULTRASOUND HEPATIC ELASTOGRAPHY  Device: Siemens Helix VTQ  Patient position: Oblique  Transducer 4V1  Number of measurements: 10  Hepatic segment: 8  Median velocity: 1.09  m/sec  IQR: 0.06  IQR/Median velocity ratio: 0.06  Corresponding Metavir fibrosis score: F0/F1  Risk of fibrosis: Minimal  Limitations of exam: None  Please note that abnormal shear wave velocities may also be identified in clinical settings other than with hepatic fibrosis, such as: acute hepatitis, elevated right heart and central venous pressures including use of beta blockers, veno-occlusive disease (Budd-Chiari), infiltrative processes such as mastocytosis/amyloidosis/infiltrative tumor, extrahepatic cholestasis, in the post-prandial state, and liver transplantation. Correlation with patient history, laboratory data, and clinical condition recommended.  IMPRESSION: ULTRASOUND ABDOMEN: Coarse hepatic echotexture with hyperechoic hepatic parenchyma and a mildly nodular hepatic contour, suggesting hepatocellular disease such as early cirrhosis and/or hepatic steatosis.  No focal hepatic lesion is seen.  ULTRASOUND HEPATIC ELASTOGRAPHY:  Median hepatic shear wave velocity is calculated at 1.09 m/sec.  Corresponding Metavir fibrosis score is F0/F1.  Risk of fibrosis is Minimal.  Follow-up: None required   Electronically Signed By: Charline Bills M.D. On: 01/11/2018 10:40  CLINICAL DATA: Intractable vomiting with nausea, and RIGHT upper quadrant pain, some diarrhea, prior umbilical hernia repair, history of fatty liver disease  EXAM: NUCLEAR MEDICINE HEPATOBILIARY IMAGING WITH GALLBLADDER EF  TECHNIQUE: Sequential images of the abdomen were obtained out to 60 minutes following intravenous administration of radiopharmaceutical. After oral ingestion of Ensure, gallbladder ejection fraction was determined. At 60 min, normal ejection fraction is greater than 33%.  RADIOPHARMACEUTICALS: 4.936 mCi Tc-86m Choletec IV  COMPARISON: None  FINDINGS: Normal tracer extraction from bloodstream indicating normal hepatocellular  function.  Normal excretion of tracer into biliary tree.  Gallbladder visualized at 42 min.  Small bowel visualized at 14 min.  No hepatic retention of tracer.  Subjectively normal emptying of tracer from gallbladder following fatty meal stimulation.  Calculated gallbladder ejection fraction is 70%, normal.  Patient reported increasing abdominal pain following Ensure ingestion.  Normal gallbladder ejection fraction following Ensure ingestion is greater than 33% at 1 hour.  IMPRESSION: Patent biliary tree.  Normal gallbladder ejection fraction of 70%.  Patient reported increasing abdominal pain following Ensure ingestion.   Electronically Signed  By: Ulyses Southward M.D.  On: 07/12/2018 18:19  Other Result Information  Interface, Rad Results In - 07/12/2018  6:21 PM EST CLINICAL DATA:  Intractable vomiting with nausea, and RIGHT upper quadrant pain, some diarrhea, prior umbilical hernia repair, history of fatty liver disease  EXAM: NUCLEAR MEDICINE HEPATOBILIARY IMAGING WITH GALLBLADDER EF  TECHNIQUE: Sequential images of the abdomen were obtained out to 60 minutes following intravenous administration of radiopharmaceutical. After oral ingestion of Ensure, gallbladder ejection fraction was determined. At 60 min, normal ejection fraction is greater than 33%.  RADIOPHARMACEUTICALS:  4.936 mCi Tc-69m  Choletec IV  COMPARISON:  None  FINDINGS: Normal tracer extraction from bloodstream indicating normal hepatocellular function.  Normal excretion of tracer into biliary tree.  Gallbladder visualized at 42 min.  Small bowel visualized at 14 min.  No hepatic retention of tracer.  Subjectively normal emptying of tracer from gallbladder following fatty meal stimulation.  Calculated gallbladder ejection fraction is 70%, normal.  Patient reported increasing abdominal pain following Ensure ingestion.  Normal gallbladder ejection fraction following Ensure  ingestion is greater than 33% at 1 hour.  IMPRESSION: Patent biliary tree.  Normal gallbladder ejection fraction of 70%.  Patient reported increasing abdominal pain following Ensure ingestion.   Electronically Signed   By: Ulyses Southward M.D.   On: 07/12/2018  18:19   Result Impression  --Enlarged, echogenic liver which can be seen with fibrofatty disease. --No cholelithiasis or biliary ductal dilatation.  Please see below for data measurements: Liver: 17.8 cm  Gallbladder wall: 2.9 mm Sonographic Murphy's Sign: negative Pericholecystic fluid visualized: no  Common hepatic duct: 0.28 cm Proximal common bile duct: 0.37 cm Distal common bile duct: 0.61 cm  Right kidney length: 10.8 cm  Result Narrative  EXAM: US ABDOMEN LIMITED DATE: 05/16/2018 2:20 AM ACCESSION: [id number] UN DICTATED: 05/16/2018 2:26 AM INTERPRETATION LOCATION: [facility]  CLINICAL INDICATION: 53 years old Female with RUQ pain worse after eating intermittent x 1 mo, worse today. NPO since 530 pm   TECHNIQUE: Static and cine images of the right upper quadrant were performed.  COMPARISON: None  FINDINGS:   LIVER: The liver is mildly enlarged and echogenic. Heterogeneity of the liver limits evaluation for focal liver lesions. No focal hepatic lesions. No biliary ductal dilatation. Distal common bile duct is upper normal limits in size.  GALLBLADDER: The gallbladder is physiologically distended without internal stones or sludge. Sonographic Eulah Pont sign is negative.  No pericholecystic fluid. No gallbladder wall thickening.  LIMITED RIGHT KIDNEY: Right pelviectasis without hydronephrosis.  Other Result Information  Interface, Rad Results In - 05/16/2018  7:32 AM EST EXAM: US ABDOMEN LIMITED DATE: 05/16/2018 2:20 AM ACCESSION: [id number] UN DICTATED: 05/16/2018 2:26 AM INTERPRETATION LOCATION: [facility]  CLINICAL INDICATION: 53 years old Female with RUQ pain worse after eating intermittent x 1 mo,  worse today.  NPO since 530 pm    TECHNIQUE: Static and cine images of the right upper quadrant were performed.  COMPARISON: None  FINDINGS:   LIVER: The liver is mildly enlarged and echogenic. Heterogeneity of the liver limits evaluation for focal liver lesions. No focal hepatic lesions. No biliary ductal dilatation. Distal common bile duct is upper normal limits in size.  GALLBLADDER: The gallbladder is physiologically distended without internal stones or sludge. Sonographic Eulah Pont sign is negative.   No pericholecystic fluid. No gallbladder wall thickening.  LIMITED RIGHT KIDNEY: Right pelviectasis without hydronephrosis.  IMPRESSION: --Enlarged, echogenic liver which can be seen with fibrofatty disease. --No cholelithiasis or biliary ductal dilatation.  Please see below for data measurements: Liver: 17.8 cm  Gallbladder wall: 2.9 mm Sonographic Murphy's Sign: negative Pericholecystic fluid visualized: no  Common hepatic duct: 0.28 cm Proximal common bile duct: 0.37 cm Distal common bile duct: 0.61 cm  Right kidney length: 10.8 cm    Assessment:      Biliary colic [K80.50]  NASH Hx of umbilical hernia repair  Plan:   1. Biliary colic [K80.50] Discussed the risk of surgery including post-op infxn, seroma, biloma, chronic pain, poor-delayed wound healing, retained gallstone, conversion to open procedure, post-op SBO or ileus, and need for additional procedures to address said risks.  The risks of general anesthetic including MI, CVA, sudden death or even reaction to anesthetic medications also discussed. Alternatives include continued observation.  Benefits include possible symptom relief, prevention of complications including acute cholecystitis, pancreatitis.  Typical post operative recovery of 3-5 days rest, continued pain in area and incision sites, possible loose stools up to 4-6 weeks, also discussed.  ED return precautions given for sudden increase in RUQ  pain, with possible accompanying fever, nausea, and/or vomiting.  The patient understands the risks, any and all questions were answered to the patient's satisfaction.  2. We specifically discussed that with a negative [place] and HIDA, the liklihood that removing her GB will improve her current symptoms is  much lower, but if she still wishes to undergo procedure with risks described above, I cannot strongly advise against it.  If symptoms persist, we will at least be able to focus on other etiology.  She verbalized there are no guarantees and still wishes to proceed.  Will proceed with lap chole, repeat LFTs in am due to remote hx of slight elevation and possible liver disease noted on [place].  Will also start with LUQ incision due to hx of umbilical hernia repair with mesh.  She is at increased intraoperative risk due to previous surgeries and possible liver disease.     Electronically signed by Sung Amabile, DO on 07/22/2018 10:40 AM

## 2018-07-27 ENCOUNTER — Encounter
Admission: RE | Admit: 2018-07-27 | Discharge: 2018-07-27 | Disposition: A | Discharge status: Home / Self Care | Payer: Managed Care, Other (non HMO) | Source: Ambulatory Visit | Attending: Surgery | Admitting: Surgery

## 2018-07-27 ENCOUNTER — Other Ambulatory Visit: Payer: Self-pay

## 2018-07-27 ENCOUNTER — Encounter: Payer: Self-pay | Admitting: Anesthesiology

## 2018-07-27 HISTORY — DX: Headache, unspecified: R51.9

## 2018-07-27 HISTORY — DX: Fatty (change of) liver, not elsewhere classified: K76.0

## 2018-07-27 HISTORY — DX: Other complications of anesthesia, initial encounter: T88.59XA

## 2018-07-27 HISTORY — DX: Nausea with vomiting, unspecified: R11.2

## 2018-07-27 HISTORY — DX: Unspecified osteoarthritis, unspecified site: M19.90

## 2018-07-27 HISTORY — DX: Gastro-esophageal reflux disease without esophagitis: K21.9

## 2018-07-27 HISTORY — DX: Headache: R51

## 2018-07-27 HISTORY — DX: Other specified postprocedural states: Z98.890

## 2018-07-27 HISTORY — DX: Adverse effect of unspecified anesthetic, initial encounter: T41.45XA

## 2018-07-27 MED ORDER — CEFAZOLIN SODIUM-DEXTROSE 2-4 GM/100ML-% IV SOLN
2.0000 g | INTRAVENOUS | Status: AC
  Administered 2018-07-28: 2 g via INTRAVENOUS

## 2018-07-27 NOTE — Patient Instructions (Signed)
Your procedure is scheduled on: 07-28-18 Report to Same Day Surgery 2nd floor medical mall [facility] Entrance-take elevator on left to 2nd floor.  Check in with surgery information desk.) @ 6 AM PER PT  Remember: Instructions that are not followed completely may result in serious medical risk, up to and including death, or upon the discretion of your surgeon and anesthesiologist your surgery may need to be rescheduled.    _x___ 1. Do not eat food after midnight the night before your procedure. You may drink clear liquids up to 2 hours before you are scheduled to arrive at the hospital for your procedure.  Do not drink clear liquids within 2 hours of your scheduled arrival to the hospital.  Clear liquids include  --Water or Apple juice without pulp  --Clear carbohydrate beverage such as ClearFast or Gatorade  --Black Coffee or Clear Tea (No milk, no creamers, do not add anything to the coffee or Tea  ____Ensure clear carbohydrate drink on the way to the hospital for bariatric patients  ____Ensure clear carbohydrate drink 3 hours before surgery for Dr Rutherford Nail patients if physician instructed.   No gum chewing or hard candies.     __x__ 2. No Alcohol for 24 hours before or after surgery.   __x__3. No Smoking or e-cigarettes for 24 prior to surgery.  Do not use any chewable tobacco products for at least 6 hour prior to surgery   ____  4. Bring all medications with you on the day of surgery if instructed.    __x__ 5. Notify your doctor if there is any change in your medical condition     (cold, fever, infections).    x___6. On the morning of surgery brush your teeth with toothpaste and water.  You may rinse your mouth with mouth wash if you wish.  Do not swallow any toothpaste or mouthwash.   Do not wear jewelry, make-up, hairpins, clips or nail polish.  Do not wear lotions, powders, or perfumes. You may wear deodorant.  Do not shave 48 hours prior to surgery. Men may shave face and  neck.  Do not bring valuables to the hospital.    [facility] is not responsible for any belongings or valuables.               Contacts, dentures or bridgework may not be worn into surgery.  Leave your suitcase in the car. After surgery it may be brought to your room.  For patients admitted to the hospital, discharge time is determined by your  treatment team.  _  Patients discharged the day of surgery will not be allowed to drive home.  You will need someone to drive you home and stay with you the night of your procedure.    Please read over the following fact sheets that you were given:   [facility] Preparing for Surgery   _x___ TAKE THE FOLLOWING MEDICATION THE MORNING OF SURGERY WITH A SMALL SIP OF WATER. These include:  1. PROTONIX  2. TAKE A PROTONIX TONIGHT BEFORE BED  3.  4.  5.  6.  ____Fleets enema or Magnesium Citrate as directed.   ____ Use CHG Soap or sage wipes as directed on instruction sheet   ____ Use inhalers on the day of surgery and bring to hospital day of surgery  ____ Stop Metformin and Janumet 2 days prior to surgery.    ____ Take 1/2 of usual insulin dose the night before surgery and none on the morning  surgery.   ____ Follow recommendations from Cardiologist, Pulmonologist or PCP regarding          stopping Aspirin, Coumadin, Plavix ,Eliquis, Effient, or Pradaxa, and Pletal.  X____Stop Anti-inflammatories such as Advil, Aleve, Ibuprofen, Motrin, Naproxen, Naprosyn, Goodies powders or aspirin products NOW-OK to take Tylenol    _X___ Stop supplements until after surgery-PT STOPPED AIRBORNE ON Monday 16TH   ____ Bring C-Pap to the hospital.

## 2018-07-27 NOTE — Pre-Procedure Instructions (Signed)
EKG  I personally interpreted any EKGs ordered by me or triage Sinus rhythm rate 85 bpm, no acute ST elevation or depression, RSR prime configuration appreciated, no ____________________________________________  RADIOLOGY  Pertinent labs & imaging results that were available during my care of the patient were reviewed by me and considered in my medical decision making (see chart for details). If possible, patient and/or family made aware of any abnormal findings.  Dg Chest 2 View  Result Date: 06/08/2018 CLINICAL DATA:  Chest pain. EXAM: CHEST - 2 VIEW COMPARISON:  07/17/2011 FINDINGS: The heart size and mediastinal contours are within normal limits. Both lungs are clear. The visualized skeletal structures are unremarkable. IMPRESSION: Normal exam. Electronically Signed   By: Francene Boyers M.D.   On: 06/08/2018 09:50   ____________________________________________    PROCEDURES  Procedure(s) performed: None  Procedures  Critical Care performed: None  ____________________________________________   INITIAL IMPRESSION / ASSESSMENT AND PLAN / ED COURSE  Pertinent labs & imaging results that were available during my care of the patient were reviewed by me and considered in my medical decision making (see chart for details).  Patient here for positional epigastric abdominal pain that goes up towards her esophagus and a burning manner which is worse with certain foods has been intermittently present for 1 year, worse when she lies flat, better when she takes antiacid's, with a negative ultrasound previously and no significant risk factors for CAD.  There is some reducible discomfort to her epigastric region, she has been now at least 9 hours since the onset of symptoms and she is no evidence of elevated troponin or other significant markers for likely ongoing ischemia.  I do not think this represents a PE, patient has no pleuritic component to this whatsoever.  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  This is most consistent with either gallbladder disease or reflux disease but she had a reassuring ultrasound 3 weeks ago at [facility] the results of which I personally reviewed.  Given her exam I have low suspicion for the development of gallbladder stones in that short period of time.  There is no evidence that her reflux disease has resulted in a perforation, or any rectal or internal bleeding fortunately.  Her exam is quite benign I do not think she has retroperitoneal or intra-abdominal surgical emergency at this time.  I will however check liver function test, she states she has followed up with a liver specialist for her fatty liver, I will also obtain a lipase, and we will recheck troponin as a precaution only given her age.  ----------------------------------------- 2:12 PM on 06/08/2018 -----------------------------------------  Patient has a benign abdomen negative troponins x2- lipase reassuring liver function test, we talked about admission to the hospital her strong preference is to go home.  She states she has had stress test for this exact symptom 3 to 4 years ago, she does not want to stay in the hospital she understands the risks benefits and alternatives to going home.  We did have our customary conversation.  I have therefore arranged for her to take antacids on a daily basis, and I have provided her with close follow-up and instructed her how to follow-up with GI medicine and cardiology.  Cardiology as much for her peace of mind is anything is at this time there is no real indication that she is having ACS  nonetheless I do think it is be in her best interest to follow-up with them as well.  Extensive return precautions given and understood.  ____________________________________________   FINAL CLINICAL IMPRESSION(S) / ED DIAGNOSES   Final diagnoses:  None      This chart was dictated using voice recognition software.  Despite best efforts to proofread,  errors can occur which can change meaning.      Jeanmarie Plant, MD 06/08/18 1217    Jeanmarie Plant, MD 06/08/18 1413     Electronically signed by Jeanmarie Plant, MD at 06/08/2018 12:17 PM Electronically signed by Jeanmarie Plant, MD at 06/08/2018 2:13 PM     ED on 06/08/2018       Revision History       Detailed Report      Note shared with patient

## 2018-07-28 ENCOUNTER — Ambulatory Visit
Admission: RE | Admit: 2018-07-28 | Discharge: 2018-07-28 | Disposition: A | Discharge status: Home / Self Care | Payer: Managed Care, Other (non HMO) | Attending: Surgery | Admitting: Surgery

## 2018-07-28 ENCOUNTER — Other Ambulatory Visit: Payer: Self-pay

## 2018-07-28 ENCOUNTER — Encounter
Admission: RE | Disposition: A | Discharge status: Home / Self Care | Payer: Self-pay | Source: Home / Self Care | Attending: Surgery

## 2018-07-28 ENCOUNTER — Encounter: Payer: Self-pay | Admitting: *Deleted

## 2018-07-28 ENCOUNTER — Ambulatory Visit: Payer: Managed Care, Other (non HMO) | Admitting: Anesthesiology

## 2018-07-28 DIAGNOSIS — K811 Chronic cholecystitis: Secondary | ICD-10-CM | POA: Diagnosis not present

## 2018-07-28 DIAGNOSIS — K8050 Calculus of bile duct without cholangitis or cholecystitis without obstruction: Secondary | ICD-10-CM

## 2018-07-28 DIAGNOSIS — K219 Gastro-esophageal reflux disease without esophagitis: Secondary | ICD-10-CM | POA: Diagnosis not present

## 2018-07-28 DIAGNOSIS — K760 Fatty (change of) liver, not elsewhere classified: Secondary | ICD-10-CM | POA: Diagnosis not present

## 2018-07-28 DIAGNOSIS — K8020 Calculus of gallbladder without cholecystitis without obstruction: Secondary | ICD-10-CM | POA: Diagnosis present

## 2018-07-28 DIAGNOSIS — Z791 Long term (current) use of non-steroidal anti-inflammatories (NSAID): Secondary | ICD-10-CM | POA: Diagnosis not present

## 2018-07-28 HISTORY — PX: CHOLECYSTECTOMY: SHX55

## 2018-07-28 LAB — HEPATIC FUNCTION PANEL
ALT: 49 U/L — ABNORMAL HIGH (ref 0–44)
AST: 37 U/L (ref 15–41)
Albumin: 3.8 g/dL (ref 3.5–5.0)
Alkaline Phosphatase: 62 U/L (ref 38–126)
BILIRUBIN DIRECT: 0.1 mg/dL (ref 0.0–0.2)
Indirect Bilirubin: 0.3 mg/dL (ref 0.3–0.9)
Total Bilirubin: 0.4 mg/dL (ref 0.3–1.2)
Total Protein: 7.3 g/dL (ref 6.5–8.1)

## 2018-07-28 SURGERY — LAPAROSCOPIC CHOLECYSTECTOMY
Anesthesia: General

## 2018-07-28 MED ORDER — DEXAMETHASONE SODIUM PHOSPHATE 10 MG/ML IJ SOLN
INTRAMUSCULAR | Status: AC
  Filled 2018-07-28: qty 1

## 2018-07-28 MED ORDER — ONDANSETRON HCL 4 MG/2ML IJ SOLN
INTRAMUSCULAR | Status: AC
  Filled 2018-07-28: qty 2

## 2018-07-28 MED ORDER — DOCUSATE SODIUM 100 MG PO CAPS: 100.0000 mg | ORAL_CAPSULE | Freq: Two times a day (BID) | ORAL | 0 refills | Status: AC | PRN

## 2018-07-28 MED ORDER — SUGAMMADEX SODIUM 200 MG/2ML IV SOLN
INTRAVENOUS | Status: AC
  Filled 2018-07-28: qty 2

## 2018-07-28 MED ORDER — CEFAZOLIN SODIUM-DEXTROSE 2-4 GM/100ML-% IV SOLN
INTRAVENOUS | Status: AC
  Filled 2018-07-28: qty 100

## 2018-07-28 MED ORDER — ACETAMINOPHEN 500 MG PO TABS
ORAL_TABLET | ORAL | Status: AC
  Administered 2018-07-28: 1000 mg via ORAL
  Filled 2018-07-28: qty 2

## 2018-07-28 MED ORDER — PROPOFOL 10 MG/ML IV BOLUS
INTRAVENOUS | Status: AC
  Filled 2018-07-28: qty 20

## 2018-07-28 MED ORDER — IBUPROFEN 800 MG PO TABS: 800.0000 mg | ORAL_TABLET | Freq: Three times a day (TID) | ORAL | 0 refills | Status: DC | PRN

## 2018-07-28 MED ORDER — MIDAZOLAM HCL 2 MG/2ML IJ SOLN
INTRAMUSCULAR | Status: DC | PRN
  Administered 2018-07-28: 2 mg via INTRAVENOUS

## 2018-07-28 MED ORDER — ACETAMINOPHEN 500 MG PO TABS
1000.0000 mg | ORAL_TABLET | ORAL | Status: AC
  Administered 2018-07-28: 1000 mg via ORAL

## 2018-07-28 MED ORDER — ONDANSETRON HCL 4 MG PO TABS: 4.0000 mg | ORAL_TABLET | Freq: Three times a day (TID) | ORAL | 0 refills | Status: AC | PRN

## 2018-07-28 MED ORDER — MIDAZOLAM HCL 2 MG/2ML IJ SOLN
INTRAMUSCULAR | Status: AC
  Filled 2018-07-28: qty 2

## 2018-07-28 MED ORDER — PROMETHAZINE HCL 25 MG/ML IJ SOLN
6.2500 mg | INTRAMUSCULAR | Status: AC
  Administered 2018-07-28: 6.25 mg via INTRAVENOUS

## 2018-07-28 MED ORDER — ACETAMINOPHEN 325 MG PO TABS: 650.0000 mg | ORAL_TABLET | Freq: Three times a day (TID) | ORAL | 0 refills | Status: AC | PRN

## 2018-07-28 MED ORDER — HYDROCODONE-ACETAMINOPHEN 5-325 MG PO TABS
ORAL_TABLET | ORAL | Status: AC
  Filled 2018-07-28: qty 1

## 2018-07-28 MED ORDER — HYDROCODONE-ACETAMINOPHEN 5-325 MG PO TABS: 1.0000 | ORAL_TABLET | Freq: Four times a day (QID) | ORAL | 0 refills | Status: AC | PRN

## 2018-07-28 MED ORDER — FENTANYL CITRATE (PF) 100 MCG/2ML IJ SOLN
INTRAMUSCULAR | Status: AC
  Filled 2018-07-28: qty 2

## 2018-07-28 MED ORDER — PROMETHAZINE HCL 25 MG/ML IJ SOLN
INTRAMUSCULAR | Status: AC
  Filled 2018-07-28: qty 1

## 2018-07-28 MED ORDER — ONDANSETRON HCL 4 MG/2ML IJ SOLN
4.0000 mg | Freq: Once | INTRAMUSCULAR | Status: AC | PRN
  Administered 2018-07-28: 4 mg via INTRAVENOUS

## 2018-07-28 MED ORDER — DEXAMETHASONE SODIUM PHOSPHATE 10 MG/ML IJ SOLN
INTRAMUSCULAR | Status: DC | PRN
  Administered 2018-07-28: 10 mg via INTRAVENOUS

## 2018-07-28 MED ORDER — CELECOXIB 200 MG PO CAPS
200.0000 mg | ORAL_CAPSULE | ORAL | Status: AC
  Administered 2018-07-28: 200 mg via ORAL

## 2018-07-28 MED ORDER — GABAPENTIN 300 MG PO CAPS
ORAL_CAPSULE | ORAL | Status: AC
  Administered 2018-07-28: 300 mg via ORAL
  Filled 2018-07-28: qty 1

## 2018-07-28 MED ORDER — LIDOCAINE HCL (PF) 2 % IJ SOLN
INTRAMUSCULAR | Status: AC
  Filled 2018-07-28: qty 10

## 2018-07-28 MED ORDER — LIDOCAINE-EPINEPHRINE (PF) 1 %-1:200000 IJ SOLN
INTRAMUSCULAR | Status: AC
  Filled 2018-07-28: qty 30

## 2018-07-28 MED ORDER — PROPOFOL 10 MG/ML IV BOLUS
INTRAVENOUS | Status: DC | PRN
  Administered 2018-07-28: 150 mg via INTRAVENOUS
  Administered 2018-07-28: 20 mg via INTRAVENOUS

## 2018-07-28 MED ORDER — ROCURONIUM BROMIDE 100 MG/10ML IV SOLN
INTRAVENOUS | Status: DC | PRN
  Administered 2018-07-28: 50 mg via INTRAVENOUS

## 2018-07-28 MED ORDER — LIDOCAINE-EPINEPHRINE (PF) 1 %-1:200000 IJ SOLN
INTRAMUSCULAR | Status: DC | PRN
  Administered 2018-07-28: 11 mL via INTRAMUSCULAR

## 2018-07-28 MED ORDER — GABAPENTIN 300 MG PO CAPS
300.0000 mg | ORAL_CAPSULE | ORAL | Status: AC
  Administered 2018-07-28: 300 mg via ORAL

## 2018-07-28 MED ORDER — LIDOCAINE HCL (CARDIAC) PF 100 MG/5ML IV SOSY
PREFILLED_SYRINGE | INTRAVENOUS | Status: DC | PRN
  Administered 2018-07-28: 80 mg via INTRAVENOUS

## 2018-07-28 MED ORDER — HYDROCODONE-ACETAMINOPHEN 5-325 MG PO TABS
1.0000 | ORAL_TABLET | Freq: Once | ORAL | Status: AC
  Administered 2018-07-28: 1 via ORAL

## 2018-07-28 MED ORDER — CELECOXIB 200 MG PO CAPS
ORAL_CAPSULE | ORAL | Status: AC
  Administered 2018-07-28: 200 mg via ORAL
  Filled 2018-07-28: qty 1

## 2018-07-28 MED ORDER — CHLORHEXIDINE GLUCONATE CLOTH 2 % EX PADS: 6.0000 | MEDICATED_PAD | Freq: Once | CUTANEOUS | Status: DC

## 2018-07-28 MED ORDER — FENTANYL CITRATE (PF) 100 MCG/2ML IJ SOLN: 25.0000 ug | INTRAMUSCULAR | Status: DC | PRN

## 2018-07-28 MED ORDER — FENTANYL CITRATE (PF) 100 MCG/2ML IJ SOLN
INTRAMUSCULAR | Status: DC | PRN
  Administered 2018-07-28 (×2): 50 ug via INTRAVENOUS

## 2018-07-28 MED ORDER — LACTATED RINGERS IV SOLN
INTRAVENOUS | Status: DC
  Administered 2018-07-28: 07:00:00 via INTRAVENOUS

## 2018-07-28 MED ORDER — ROCURONIUM BROMIDE 50 MG/5ML IV SOLN
INTRAVENOUS | Status: AC
  Filled 2018-07-28: qty 1

## 2018-07-28 MED ORDER — BUPIVACAINE HCL (PF) 0.5 % IJ SOLN
INTRAMUSCULAR | Status: AC
  Filled 2018-07-28: qty 30

## 2018-07-28 SURGICAL SUPPLY — 56 items
ANCHOR TIS RET SYS 235ML (MISCELLANEOUS) IMPLANT
APPLIER CLIP 5 13 M/L LIGAMAX5 (MISCELLANEOUS) ×3
BLADE SURG SZ11 CARB STEEL (BLADE) ×3 IMPLANT
CANISTER SUCT 1200ML W/VALVE (MISCELLANEOUS) ×3 IMPLANT
CHLORAPREP W/TINT 26 (MISCELLANEOUS) ×3 IMPLANT
CHOLANGIOGRAM CATH TAUT (CATHETERS) IMPLANT
CLIP APPLIE 5 13 M/L LIGAMAX5 (MISCELLANEOUS) ×1 IMPLANT
COVER WAND RF STERILE (DRAPES) IMPLANT
DECANTER SPIKE VIAL GLASS SM (MISCELLANEOUS) ×3 IMPLANT
DEFOGGER SCOPE WARMER CLEARIFY (MISCELLANEOUS) IMPLANT
DERMABOND ADVANCED (GAUZE/BANDAGES/DRESSINGS) ×2
DERMABOND ADVANCED .7 DNX12 (GAUZE/BANDAGES/DRESSINGS) ×1 IMPLANT
DISSECTOR BLUNT TIP ENDO 5MM (MISCELLANEOUS) IMPLANT
DISSECTOR KITTNER STICK (MISCELLANEOUS) IMPLANT
DISSECTORS/KITTNER STICK (MISCELLANEOUS)
DRAPE C-ARM XRAY 36X54 (DRAPES) IMPLANT
DRAPE SHEET LG 3/4 BI-LAMINATE (DRAPES) IMPLANT
ELECT CAUTERY BLADE 6.4 (BLADE) IMPLANT
ELECT REM PT RETURN 9FT ADLT (ELECTROSURGICAL) ×3
ELECTRODE REM PT RTRN 9FT ADLT (ELECTROSURGICAL) ×1 IMPLANT
GLOVE BIOGEL PI IND STRL 7.0 (GLOVE) ×1 IMPLANT
GLOVE BIOGEL PI INDICATOR 7.0 (GLOVE) ×2
GLOVE SURG SYN 7.0 (GLOVE) ×3 IMPLANT
GOWN STRL REUS W/ TWL LRG LVL3 (GOWN DISPOSABLE) ×4 IMPLANT
GOWN STRL REUS W/TWL LRG LVL3 (GOWN DISPOSABLE) ×8
GRASPER SUT TROCAR 14GX15 (MISCELLANEOUS) ×3 IMPLANT
IRRIGATION STRYKERFLOW (MISCELLANEOUS) ×1 IMPLANT
IRRIGATOR STRYKERFLOW (MISCELLANEOUS) ×3
IV CATH ANGIO 12GX3 LT BLUE (NEEDLE) IMPLANT
IV NS 1000ML (IV SOLUTION)
IV NS 1000ML BAXH (IV SOLUTION) IMPLANT
JACKSON PRATT 10 (INSTRUMENTS) IMPLANT
L-HOOK LAP DISP 36CM (ELECTROSURGICAL) ×3
LHOOK LAP DISP 36CM (ELECTROSURGICAL) ×1 IMPLANT
NEEDLE HYPO 22GX1.5 SAFETY (NEEDLE) ×3 IMPLANT
NEEDLE INSUFFLATION 14GA 120MM (NEEDLE) ×3 IMPLANT
NEEDLE VERESS 14GA 120MM (NEEDLE) ×3 IMPLANT
PACK LAP CHOLECYSTECTOMY (MISCELLANEOUS) ×3 IMPLANT
PENCIL ELECTRO HAND CTR (MISCELLANEOUS) ×3 IMPLANT
PORT ACCESS TROCAR AIRSEAL 5 (TROCAR) ×3 IMPLANT
SCISSORS METZENBAUM CVD 33 (INSTRUMENTS) ×3 IMPLANT
SET TRI-LUMEN FLTR TB AIRSEAL (TUBING) ×3 IMPLANT
SLEEVE ENDOPATH XCEL 5M (ENDOMECHANICALS) ×6 IMPLANT
SPONGE LAP 18X18 RF (DISPOSABLE) IMPLANT
STOPCOCK 4 WAY LG BORE MALE ST (IV SETS) IMPLANT
SUT MNCRL 4-0 (SUTURE) ×2
SUT MNCRL 4-0 27XMFL (SUTURE) ×1
SUT VIC AB 3-0 SH 27 (SUTURE)
SUT VIC AB 3-0 SH 27X BRD (SUTURE) IMPLANT
SUT VICRYL 0 AB UR-6 (SUTURE) ×3 IMPLANT
SUTURE MNCRL 4-0 27XMF (SUTURE) ×1 IMPLANT
SYR 20CC LL (SYRINGE) ×3 IMPLANT
TOWEL OR 17X26 4PK STRL BLUE (TOWEL DISPOSABLE) IMPLANT
TROCAR XCEL BLUNT TIP 100MML (ENDOMECHANICALS) ×3 IMPLANT
TROCAR XCEL NON-BLD 5MMX100MML (ENDOMECHANICALS) ×3 IMPLANT
WATER STERILE IRR 1000ML POUR (IV SOLUTION) IMPLANT

## 2018-07-28 NOTE — Transfer of Care (Signed)
Immediate Anesthesia Transfer of Care Note  Patient: [name]  Procedure(s) Performed: LAPAROSCOPIC CHOLECYSTECTOMY (N/A )  Patient Location: PACU  Anesthesia Type:General  Level of Consciousness: awake, alert  and oriented  Airway & Oxygen Therapy: Patient Spontanous Breathing and Patient connected to face mask oxygen  Post-op Assessment: Report given to RN and Post -op Vital signs reviewed and stable  Post vital signs: Reviewed and stable  Last Vitals:  Vitals Value Taken Time  BP 151/88 07/28/2018  9:14 AM  Temp    Pulse 66 07/28/2018  9:16 AM  Resp 16 07/28/2018  9:17 AM  SpO2 100 % 07/28/2018  9:16 AM  Vitals shown include unvalidated device data.  Last Pain:  Vitals:   07/28/18 0618  TempSrc: Tympanic  PainSc: 5       Patients Stated Pain Goal: 3 (07/28/18 0618)  Complications: No apparent anesthesia complications

## 2018-07-28 NOTE — Anesthesia Post-op Follow-up Note (Signed)
Anesthesia QCDR form completed.        

## 2018-07-28 NOTE — Op Note (Signed)
Preoperative diagnosis:  biliary colic  Postoperative diagnosis: same as above  Procedure: Laparoscopic Cholecystectomy.   Anesthesia: GETA   Surgeon: Sung Amabile  Specimen: Gallbladder  Complications: None  EBL: 25mL  Wound Classification: Clean Contaminated  Indications: see HPI  Findings: Critical view of safety noted Cystic duct and artery identified, ligated and divided, clips remained intact at end of procedure Adequate hemostasis  Description of procedure: The patient was placed on the operating table in the supine position. SCDs placed, pre-op abx administered.  General anesthesia was induced and OG tube placed by anesthesia. A time-out was completed verifying correct patient, procedure, site, positioning, and implant(s) and/or special equipment prior to beginning this procedure. The abdomen was prepped and draped in the usual sterile fashion.  An incision was made at palmer's point due to hx of umbilical hernia repair.  Veress needle used to insufflate abdomen after hearing two clicks and saline drop test positive.  Once up to 58mm Hg without any issues, veress needle removed and 60mm port placed under optiview technique. The laparoscope was inserted and the abdomen inspected. No injuries from initial trocar placement were noted. Additional trocars were then inserted under direct visualization in the following locations: a 12-mm trocar in the subxyphoid region and two 5-mm trocars along the right costal margin. The abdomen was inspected and no abnormalities or injuries were found. The table was placed in the reverse Trendelenburg position with the right side up.   Filmy adhesions between the gallbladder and omentum, duodenum and transverse colon were lysed sharply. The dome of the gallbladder was grasped with an atraumatic grasper passed through the lateral port and retracted over the dome of the liver. The infundibulum was also grasped with an atraumatic grasper and retracted  toward the right lower quadrant. This maneuver exposed Calot's triangle. The peritoneum overlying the gallbladder infundibulum was then dissected and the cystic duct and cystic artery identified.  Critical view of safety with the liver bed clearly visible behind the duct and artery with no additional structures noted.  Picture taken before the cystic duct and cystic artery clipped and divided close to the gallbladder.  The gallbladder was then dissected from its peritoneal and liver bed attachments by electrocautery.  Additional clip was placed on an peritoneal artery during this portion of exam.  Hemostasis was checked and the gallbladder was removed using an endoscopic retrieval bag placed through the 71mm port. The gallbladder was passed off the table as a specimen. The gallbladder fossa was copiously irrigated with saline and any leaked bile was suctioned out, and hemostasis was obtained. There was no evidence of bleeding from the gallbladder fossa or cystic artery or leakage of the bile from the cystic duct stump. Abdomen desufflated and trocars were removed.  All skin incisions then closed with subcuticular sutures of 4-0 monocryl and dressed with topical skin adhesive. The orogastric tube was removed and patient extubated. The patient tolerated the procedure well and was taken to the postanesthesia care unit in stable condition.  All sponge and instrument count correct at end of procedure.

## 2018-07-28 NOTE — Anesthesia Procedure Notes (Signed)
Procedure Name: Intubation Date/Time: 07/28/2018 7:43 AM Performed by: Danelle Berry, CRNA Pre-anesthesia Checklist: Patient identified, Emergency Drugs available, Suction available, Patient being monitored and Timeout performed Patient Re-evaluated:Patient Re-evaluated prior to induction Oxygen Delivery Method: Circle system utilized and Simple face mask Preoxygenation: Pre-oxygenation with 100% oxygen Induction Type: IV induction Laryngoscope Size: McGraph and 3 Grade View: Grade II Tube type: Oral Number of attempts: 2 Airway Equipment and Method: Stylet Placement Confirmation: ETT inserted through vocal cords under direct vision,  positive ETCO2 and breath sounds checked- equal and bilateral Secured at: 20 cm Tube secured with: Tape Dental Injury: Teeth and Oropharynx as per pre-operative assessment

## 2018-07-28 NOTE — Interval H&P Note (Signed)
History and Physical Interval Note:  07/28/2018 7:04 AM  Tamara [name]  has presented today for surgery, with the diagnosis of K80.50 BILIARY COLIC.  The various methods of treatment have been discussed with the patient and family. After consideration of risks, benefits and other options for treatment, the patient has consented to  Procedure(s): LAPAROSCOPIC CHOLECYSTECTOMY (N/A) as a surgical intervention.  The patient's history has been reviewed, patient examined, no change in status, stable for surgery.  I have reviewed the patient's chart and labs.  Questions were answered to the patient's satisfaction.     Raha Tennison Tonna Boehringer

## 2018-07-28 NOTE — Anesthesia Preprocedure Evaluation (Signed)
Anesthesia Evaluation  Patient identified by MRN, date of birth, ID band Patient awake    Reviewed: Allergy & Precautions, NPO status , Patient's Chart, lab work & pertinent test results, reviewed documented beta blocker date and time   History of Anesthesia Complications (+) PONV and history of anesthetic complications  Airway Mallampati: III  TM Distance: >3 FB     Dental  (+) Chipped   Pulmonary           Cardiovascular      Neuro/Psych  Headaches,    GI/Hepatic GERD  Controlled,  Endo/Other    Renal/GU      Musculoskeletal  (+) Arthritis ,   Abdominal   Peds  Hematology   Anesthesia Other Findings 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 No cardiac symptoms.  Reproductive/Obstetrics                             Anesthesia Physical Anesthesia Plan  ASA: II  Anesthesia Plan: General   Post-op Pain Management:    Induction: Intravenous  PONV Risk Score and Plan:   Airway Management Planned: Oral ETT  Additional Equipment:   Intra-op Plan:   Post-operative Plan:   Informed Consent: I have reviewed the patients History and Physical, chart, labs and discussed the procedure including the risks, benefits and alternatives for the proposed anesthesia with the patient or authorized representative who has indicated his/her understanding and acceptance.       Plan Discussed with: CRNA  Anesthesia Plan Comments:         Anesthesia Quick Evaluation

## 2018-07-28 NOTE — Discharge Instructions (Addendum)
Laparoscopic Cholecystectomy, Care After This sheet gives you information about how to care for yourself after your procedure. Your doctor may also give you more specific instructions. If you have problems or questions, contact your doctor. Follow these instructions at home: Care for cuts from surgery (incisions)   Follow instructions from your doctor about how to take care of your cuts from surgery. Make sure you: ? Wash your hands with soap and water before you change your bandage (dressing). If you cannot use soap and water, use hand sanitizer. ? Change your bandage as told by your doctor. ? Leave stitches (sutures), skin glue, or skin tape (adhesive) strips in place. They may need to stay in place for 2 weeks or longer. If tape strips get loose and curl up, you may trim the loose edges. Do not remove tape strips completely unless your doctor says it is okay.  Do not take baths, swim, or use a hot tub until your doctor says it is okay. OK TO SHOWER 24HRS AFTER YOUR SURGERY.   Check your surgical cut area every day for signs of infection. Check for: ? More redness, swelling, or pain. ? More fluid or blood. ? Warmth. ? Pus or a bad smell. Activity  Do not drive or use heavy machinery while taking prescription pain medicine.  Do not play contact sports until your doctor says it is okay.  Do not drive for 24 hours if you were given a medicine to help you relax (sedative).  Rest as needed. Do not return to work or school until your doctor says it is okay. General instructions   tylenol and advil as needed for discomfort.  Please alternate between the two every four hours as needed for pain.     Use narcotics, if prescribed, only when tylenol and motrin is not enough to control pain.   325-650mg  every 8hrs to max of 4000mg /24hrs (including the 325mg  in every norco dose) for the tylenol.     Advil up to 800mg  per dose every 8hrs as needed for pain.    To prevent or treat constipation  while you are taking prescription pain medicine, your doctor may recommend that you: ? Drink enough fluid to keep your pee (urine) clear or pale yellow. ? Take over-the-counter or prescription medicines. ? Eat foods that are high in fiber, such as fresh fruits and vegetables, whole grains, and beans. ? Limit foods that are high in fat and processed sugars, such as fried and sweet foods. Contact a doctor if:  You develop a rash.  You have more redness, swelling, or pain around your surgical cuts.  You have more fluid or blood coming from your surgical cuts.  Your surgical cuts feel warm to the touch.  You have pus or a bad smell coming from your surgical cuts.  You have a fever.  One or more of your surgical cuts breaks open. Get help right away if:  You have trouble breathing.  You have chest pain.  You have pain that is getting worse in your shoulders.  You faint or feel dizzy when you stand.  You have very bad pain in your belly (abdomen).  You are sick to your stomach (nauseous) for more than one day.  You have throwing up (vomiting) that lasts for more than one day.  You have leg pain. This information is not intended to replace advice given to you by your health care provider. Make sure you discuss any questions you have with your  health care provider. Document Released: 02/04/2008 Document Revised: 11/16/2015 Document Reviewed: 10/14/2015 Elsevier Interactive Patient Education  2019 [employer]    AMBULATORY SURGERY  DISCHARGE INSTRUCTIONS   1) The drugs that you were given will stay in your system until tomorrow so for the next 24 hours you should not:  A) Drive an automobile B) Make any legal decisions C) Drink any alcoholic beverage   2) You may resume regular meals tomorrow.  Today it is better to start with liquids and gradually work up to solid foods.  You may eat anything you prefer, but it is better to start with liquids, then soup and  crackers, and gradually work up to solid foods.   3) Please notify your doctor immediately if you have any unusual bleeding, trouble breathing, redness and pain at the surgery site, drainage, fever, or pain not relieved by medication.    4) Additional Instructions:        Please contact your physician with any problems or Same Day Surgery at [phone], Monday through Friday 6 am to 4 pm, or [place] at [facility] number at [phone].

## 2018-07-28 NOTE — Anesthesia Postprocedure Evaluation (Signed)
Anesthesia Post Note  Patient: Tamara [name]  Procedure(s) Performed: LAPAROSCOPIC CHOLECYSTECTOMY (N/A )  Patient location during evaluation: PACU Anesthesia Type: General Level of consciousness: awake and alert Pain management: pain level controlled Vital Signs Assessment: post-procedure vital signs reviewed and stable Respiratory status: spontaneous breathing, nonlabored ventilation, respiratory function stable and patient connected to nasal cannula oxygen Cardiovascular status: blood pressure returned to baseline and stable Postop Assessment: no apparent nausea or vomiting Anesthetic complications: no     Last Vitals:  Vitals:   07/28/18 1100 07/28/18 1228  BP: (!) 142/50 131/66  Pulse: 67 74  Resp:  16  Temp:    SpO2: 99% 99%    Last Pain:  Vitals:   07/28/18 1228  TempSrc:   PainSc: 4                  Kaliopi Blyden S

## 2018-07-29 LAB — SURGICAL PATHOLOGY

## 2018-10-10 HISTORY — PX: HAMMER TOE SURGERY: SHX385

## 2018-10-17 ENCOUNTER — Other Ambulatory Visit: Payer: Self-pay

## 2018-10-17 ENCOUNTER — Ambulatory Visit (INDEPENDENT_AMBULATORY_CARE_PROVIDER_SITE_OTHER): Payer: Managed Care, Other (non HMO)

## 2018-10-17 ENCOUNTER — Encounter: Payer: Self-pay | Admitting: Podiatry

## 2018-10-17 ENCOUNTER — Other Ambulatory Visit: Payer: Self-pay | Admitting: Podiatry

## 2018-10-17 ENCOUNTER — Ambulatory Visit: Payer: Managed Care, Other (non HMO) | Admitting: Podiatry

## 2018-10-17 VITALS — BP 116/61 | HR 72 | Temp 97.3°F | Resp 16

## 2018-10-17 DIAGNOSIS — M722 Plantar fascial fibromatosis: Secondary | ICD-10-CM

## 2018-10-17 DIAGNOSIS — M2042 Other hammer toe(s) (acquired), left foot: Secondary | ICD-10-CM | POA: Diagnosis not present

## 2018-10-17 DIAGNOSIS — S90122A Contusion of left lesser toe(s) without damage to nail, initial encounter: Secondary | ICD-10-CM

## 2018-10-17 NOTE — Patient Instructions (Addendum)
Plantar Fasciitis (Heel Spur Syndrome) with Rehab The plantar fascia is a fibrous, ligament-like, soft-tissue structure that spans the bottom of the foot. Plantar fasciitis is a condition that causes pain in the foot due to inflammation of the tissue. SYMPTOMS   Pain and tenderness on the underneath side of the foot.  Pain that worsens with standing or walking. CAUSES  Plantar fasciitis is caused by irritation and injury to the plantar fascia on the underneath side of the foot. Common mechanisms of injury include:  Direct trauma to bottom of the foot.  Damage to a small nerve that runs under the foot where the main fascia attaches to the heel bone.  Stress placed on the plantar fascia due to bone spurs. RISK INCREASES WITH:   Activities that place stress on the plantar fascia (running, jumping, pivoting, or cutting).  Poor strength and flexibility.  Improperly fitted shoes.  Tight calf muscles.  Flat feet.  Failure to warm-up properly before activity.  Obesity. PREVENTION  Warm up and stretch properly before activity.  Allow for adequate recovery between workouts.  Maintain physical fitness:  Strength, flexibility, and endurance.  Cardiovascular fitness.  Maintain a health body weight.  Avoid stress on the plantar fascia.  Wear properly fitted shoes, including arch supports for individuals who have flat feet.  PROGNOSIS  If treated properly, then the symptoms of plantar fasciitis usually resolve without surgery. However, occasionally surgery is necessary.  RELATED COMPLICATIONS   Recurrent symptoms that may result in a chronic condition.  Problems of the lower back that are caused by compensating for the injury, such as limping.  Pain or weakness of the foot during push-off following surgery.  Chronic inflammation, scarring, and partial or complete fascia tear, occurring more often from repeated injections.  TREATMENT  Treatment initially involves the  use of ice and medication to help reduce pain and inflammation. The use of strengthening and stretching exercises may help reduce pain with activity, especially stretches of the Achilles tendon. These exercises may be performed at home or with a therapist. Your caregiver may recommend that you use heel cups of arch supports to help reduce stress on the plantar fascia. Occasionally, corticosteroid injections are given to reduce inflammation. If symptoms persist for greater than 6 months despite non-surgical (conservative), then surgery may be recommended.   MEDICATION   If pain medication is necessary, then nonsteroidal anti-inflammatory medications, such as aspirin and ibuprofen, or other minor pain relievers, such as acetaminophen, are often recommended.  Do not take pain medication within 7 days before surgery.  Prescription pain relievers may be given if deemed necessary by your caregiver. Use only as directed and only as much as you need.  Corticosteroid injections may be given by your caregiver. These injections should be reserved for the most serious cases, because they may only be given a certain number of times.  HEAT AND COLD  Cold treatment (icing) relieves pain and reduces inflammation. Cold treatment should be applied for 10 to 15 minutes every 2 to 3 hours for inflammation and pain and immediately after any activity that aggravates your symptoms. Use ice packs or massage the area with a piece of ice (ice massage).  Heat treatment may be used prior to performing the stretching and strengthening activities prescribed by your caregiver, physical therapist, or athletic trainer. Use a heat pack or soak the injury in warm water.  SEEK IMMEDIATE MEDICAL CARE IF:  Treatment seems to offer no benefit, or the condition worsens.  Any medications   produce adverse side effects.  EXERCISES- RANGE OF MOTION (ROM) AND STRETCHING EXERCISES - Plantar Fasciitis (Heel Spur Syndrome) These exercises  may help you when beginning to rehabilitate your injury. Your symptoms may resolve with or without further involvement from your physician, physical therapist or athletic trainer. While completing these exercises, remember:   Restoring tissue flexibility helps normal motion to return to the joints. This allows healthier, less painful movement and activity.  An effective stretch should be held for at least 30 seconds.  A stretch should never be painful. You should only feel a gentle lengthening or release in the stretched tissue.  RANGE OF MOTION - Toe Extension, Flexion  Sit with your right / left leg crossed over your opposite knee.  Grasp your toes and gently pull them back toward the top of your foot. You should feel a stretch on the bottom of your toes and/or foot.  Hold this stretch for 10 seconds.  Now, gently pull your toes toward the bottom of your foot. You should feel a stretch on the top of your toes and or foot.  Hold this stretch for 10 seconds. Repeat  times. Complete this stretch 3 times per day.   RANGE OF MOTION - Ankle Dorsiflexion, Active Assisted  Remove shoes and sit on a chair that is preferably not on a carpeted surface.  Place right / left foot under knee. Extend your opposite leg for support.  Keeping your heel down, slide your right / left foot back toward the chair until you feel a stretch at your ankle or calf. If you do not feel a stretch, slide your bottom forward to the edge of the chair, while still keeping your heel down.  Hold this stretch for 10 seconds. Repeat 3 times. Complete this stretch 2 times per day.   STRETCH  Gastroc, Standing  Place hands on wall.  Extend right / left leg, keeping the front knee somewhat bent.  Slightly point your toes inward on your back foot.  Keeping your right / left heel on the floor and your knee straight, shift your weight toward the wall, not allowing your back to arch.  You should feel a gentle stretch  in the right / left calf. Hold this position for 10 seconds. Repeat 3 times. Complete this stretch 2 times per day.  STRETCH  Soleus, Standing  Place hands on wall.  Extend right / left leg, keeping the other knee somewhat bent.  Slightly point your toes inward on your back foot.  Keep your right / left heel on the floor, bend your back knee, and slightly shift your weight over the back leg so that you feel a gentle stretch deep in your back calf.  Hold this position for 10 seconds. Repeat 3 times. Complete this stretch 2 times per day.  STRETCH  Gastrocsoleus, Standing  Note: This exercise can place a lot of stress on your foot and ankle. Please complete this exercise only if specifically instructed by your caregiver.   Place the ball of your right / left foot on a step, keeping your other foot firmly on the same step.  Hold on to the wall or a rail for balance.  Slowly lift your other foot, allowing your body weight to press your heel down over the edge of the step.  You should feel a stretch in your right / left calf.  Hold this position for 10 seconds.  Repeat this exercise with a slight bend in your right /   left knee. Repeat 3 times. Complete this stretch 2 times per day.   STRENGTHENING EXERCISES - Plantar Fasciitis (Heel Spur Syndrome)  These exercises may help you when beginning to rehabilitate your injury. They may resolve your symptoms with or without further involvement from your physician, physical therapist or athletic trainer. While completing these exercises, remember:   Muscles can gain both the endurance and the strength needed for everyday activities through controlled exercises.  Complete these exercises as instructed by your physician, physical therapist or athletic trainer. Progress the resistance and repetitions only as guided.  STRENGTH - Towel Curls  Sit in a chair positioned on a non-carpeted surface.  Place your foot on a towel, keeping your heel  on the floor.  Pull the towel toward your heel by only curling your toes. Keep your heel on the floor. Repeat 3 times. Complete this exercise 2 times per day.  STRENGTH - Ankle Inversion  Secure one end of a rubber exercise band/tubing to a fixed object (table, pole). Loop the other end around your foot just before your toes.  Place your fists between your knees. This will focus your strengthening at your ankle.  Slowly, pull your big toe up and in, making sure the band/tubing is positioned to resist the entire motion.  Hold this position for 10 seconds.  Have your muscles resist the band/tubing as it slowly pulls your foot back to the starting position. Repeat 3 times. Complete this exercises 2 times per day.  Document Released: 04/27/2005 Document Revised: 07/20/2011 Document Reviewed: 08/09/2008 [employer] Patient Information 2014 [employer].   Pre-Operative Instructions  Congratulations, you have decided to take an important step towards improving your quality of life.  You can be assured that the doctors and staff at [facility] will be with you every step of the way.  Here are some important things you should know:  1. Plan to be at the surgery center/hospital at least 1 (one) hour prior to your scheduled time, unless otherwise directed by the surgical center/hospital staff.  You must have a responsible adult accompany you, remain during the surgery and drive you home.  Make sure you have directions to the surgical center/hospital to ensure you arrive on time. 2. If you are having surgery at [facility] or [facility], you will need a copy of your medical history and physical form from your family physician within one month prior to the date of surgery. We will give you a form for your primary physician to complete.  3. We make every effort to accommodate the date you request for surgery.  However, there are times where surgery dates or times have to be moved.  We  will contact you as soon as possible if a change in schedule is required.   4. No aspirin/ibuprofen for one week before surgery.  If you are on aspirin, any non-steroidal anti-inflammatory medications (Mobic, Aleve, Ibuprofen) should not be taken seven (7) days prior to your surgery.  You make take Tylenol for pain prior to surgery.  5. Medications - If you are taking daily heart and blood pressure medications, seizure, reflux, allergy, asthma, anxiety, pain or diabetes medications, make sure you notify the surgery center/hospital before the day of surgery so they can tell you which medications you should take or avoid the day of surgery. 6. No food or drink after midnight the night before surgery unless directed otherwise by surgical center/hospital staff. 7. No alcoholic beverages 24-hours prior to surgery.    No smoking 24-hours prior or 24-hours after surgery. 8. Wear loose pants or shorts. They should be loose enough to fit over bandages, boots, and casts. 9. Don't wear slip-on shoes. Sneakers are preferred. 10. Bring your boot with you to the surgery center/hospital.  Also bring crutches or a walker if your physician has prescribed it for you.  If you do not have this equipment, it will be provided for you after surgery. 11. If you have not been contacted by the surgery center/hospital by the day before your surgery, call to confirm the date and time of your surgery. 12. Leave-time from work may vary depending on the type of surgery you have.  Appropriate arrangements should be made prior to surgery with your employer. 13. Prescriptions will be provided immediately following surgery by your doctor.  Fill these as soon as possible after surgery and take the medication as directed. Pain medications will not be refilled on weekends and must be approved by the doctor. 14. Remove nail polish on the operative foot and avoid getting pedicures prior to surgery. 15. Wash the night before surgery.  The night  before surgery wash the foot and leg well with water and the antibacterial soap provided. Be sure to pay special attention to beneath the toenails and in between the toes.  Wash for at least three (3) minutes. Rinse thoroughly with water and dry well with a towel.  Perform this wash unless told not to do so by your physician.  Enclosed: 1 Ice pack (please put in freezer the night before surgery)   1 Hibiclens skin cleaner   Pre-op instructions  If you have any questions regarding the instructions, please do not hesitate to call our office.  [place]: [address], [place], [place] [address] -- [phone]  [place]: [address] [place], [place] [address] -- [phone]  [place]: [address]  [place], [place] [address] -- [phone]  [place]: [address], [address], [place], [place] [address] -- [phone]  Website: [email or web address] 

## 2018-10-17 NOTE — Progress Notes (Signed)
Subjective:  Patient ID: Tamara ButcherLou Ann Dragone, female    DOB: 05-Oct-1965,  MRN: [id number] HPI Chief Complaint  Patient presents with  . Foot Pain    Plantar heel right - aching x 6 weeks, AM pain, soaking-no help  . Toe Pain    4th toe left - notices toe is turning outward, walking on the lateral side of it, tender if toenail is not cut back  . New Patient (Initial Visit)    53 y.o. female presents with the above complaint.   ROS: She denies fever chills nausea vomiting muscle aches pains back pain chest pain shortness of breath headache.  Past Medical History:  Diagnosis Date  . Arthritis   . Complication of anesthesia   . Fatty liver   . GERD (gastroesophageal reflux disease)   . Headache    H/O MIGRAINES  . PONV (postoperative nausea and vomiting)    PT STATES AS LONG AS SHE IS GIVEN SOMETHING IV FOR NAUSEA SHE DOES OK   Past Surgical History:  Procedure Laterality Date  . CESAREAN SECTION     X2  . CHOLECYSTECTOMY N/A 07/28/2018   Procedure: LAPAROSCOPIC CHOLECYSTECTOMY;  Surgeon: Sung AmabileSakai, Isami, DO;  Location: [facility] ORS;  Service: General;  Laterality: N/A;  . DILATION AND CURETTAGE OF UTERUS    . ESOPHAGOGASTRODUODENOSCOPY  2020  . HERNIA REPAIR  1990'S  . Left Knee Arthroscopy      Current Outpatient Medications:  .  docusate sodium (COLACE) 100 MG capsule, Take 100 mg by mouth 2 (two) times daily., Disp: , Rfl:  .  pantoprazole (PROTONIX) 40 MG tablet, Take 40 mg by mouth as needed. , Disp: , Rfl:   No Known Allergies Review of Systems Objective:   Vitals:   10/17/18 0916  BP: 116/61  Pulse: 72  Resp: 16  Temp: (!) 97.3 F (36.3 C)    General: Well developed, nourished, in no acute distress, alert and oriented x3   Dermatological: Skin is warm, dry and supple bilateral. Nails x 10 are well maintained; remaining integument appears unremarkable at this time. There are no open sores, no preulcerative lesions, no rash or signs of infection present.  Vascular:  Dorsalis Pedis artery and Posterior Tibial artery pedal pulses are 2/4 bilateral with immedate capillary fill time. Pedal hair growth present. No varicosities and no lower extremity edema present bilateral.   Neruologic: Grossly intact via light touch bilateral. Vibratory intact via tuning fork bilateral. Protective threshold with Semmes Wienstein monofilament intact to all pedal sites bilateral. Patellar and Achilles deep tendon reflexes 2+ bilateral. No Babinski or clonus noted bilateral.   Musculoskeletal: No gross boney pedal deformities bilateral. No pain, crepitus, or limitation noted with foot and ankle range of motion bilateral. Muscular strength 5/5 in all groups tested bilateral pain on palpation of adductovarus rotated hammertoe deformity fourth PIPJ left.  She has pain on palpation medial calcaneal tubercle of the right heel..  Gait: Unassisted, Nonantalgic.    Radiographs:  Radiographs taken today demonstrate an adductovarus rotated hammertoe deformity fourth left.  No acute findings.  Soft tissue increase in density plantar fashion calcaneal insertion site of the right heel.  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  I injected the right heel with 20 mg Kenalog 5 mg Marcaine after sterile Betadine skin prep.  She tolerated the procedure well.  We also consented  her for a derotational arthroplasty of this painful toe fourth left I answered all the questions regarding the best my ability in layman's terms.  She understands this and is amenable to it.  Dispensed information regarding the surgery center anesthesia group and a Darco shoe.  I will follow-up with her in the near future for surgical intervention.     Max T. [place], [place]

## 2018-11-02 ENCOUNTER — Telehealth: Payer: Self-pay | Admitting: *Deleted

## 2018-11-02 NOTE — Telephone Encounter (Signed)
"  I'm supposed to be having surgery on Friday, July 17 with Dr. Milinda Pointer.  I was wondering if you could give me a call back.  I had a few questions about just general information, the recovery and a couple of dos and don'ts."

## 2018-11-03 NOTE — Telephone Encounter (Signed)
"  I am scheduled to have surgery on July 17 with Dr. Milinda Pointer.  I have a couple of questions.  How long will I need to be out of work?"  Do you sit or stand on your job?  "I sit and I'm working from home.  "You should be fine as long as you are sitting. You may want to take a couple of days off.  "Do I need to elevate while I'm sitting?"  Yes, you need to elevate as much as possible.  "Will I have to go to the lab to have a Covid test done?  The reason I ask is that I work for [employer] and I would like to know if I have antibodies for Covid in my system."  You need to call the surgical center to get that information.  "What's their phone number?"  It is [phone].

## 2018-11-23 ENCOUNTER — Other Ambulatory Visit: Payer: Self-pay | Admitting: Podiatry

## 2018-11-23 ENCOUNTER — Other Ambulatory Visit: Payer: Self-pay

## 2018-11-23 ENCOUNTER — Encounter: Payer: Self-pay | Admitting: Podiatry

## 2018-11-23 ENCOUNTER — Ambulatory Visit: Payer: Managed Care, Other (non HMO) | Admitting: Podiatry

## 2018-11-23 VITALS — Temp 97.9°F

## 2018-11-23 DIAGNOSIS — M722 Plantar fascial fibromatosis: Secondary | ICD-10-CM | POA: Diagnosis not present

## 2018-11-23 MED ORDER — ONDANSETRON HCL 4 MG PO TABS: 4.0000 mg | ORAL_TABLET | Freq: Three times a day (TID) | ORAL | 0 refills | Status: DC | PRN

## 2018-11-23 MED ORDER — CEPHALEXIN 500 MG PO CAPS: 500.0000 mg | ORAL_CAPSULE | Freq: Three times a day (TID) | ORAL | 0 refills | Status: DC

## 2018-11-23 MED ORDER — OXYCODONE-ACETAMINOPHEN 10-325 MG PO TABS: 1.0000 | ORAL_TABLET | Freq: Four times a day (QID) | ORAL | 0 refills | Status: AC | PRN

## 2018-11-23 NOTE — Progress Notes (Signed)
She presents today for follow-up of her right heel.  States that it still gives me trouble occasionally.  Objective: Vital signs are stable she is alert and oriented x3 she has pain on palpation medial calcaneal tubercle of the right heel.  Assessment: Plantar fasciitis right.  Hammertoe deformity fourth left.  Third digit left.  Plan: Discussed etiology pathology conservative or surgical therapies at this point were going performed her injection to her right heel Friday while she is under anesthesia and having surgery done on the fourth toe will also probably perform a tenotomy to the DIPJ third toe left.

## 2018-11-25 DIAGNOSIS — M2042 Other hammer toe(s) (acquired), left foot: Secondary | ICD-10-CM | POA: Diagnosis not present

## 2018-11-25 DIAGNOSIS — M722 Plantar fascial fibromatosis: Secondary | ICD-10-CM

## 2018-11-30 ENCOUNTER — Ambulatory Visit (INDEPENDENT_AMBULATORY_CARE_PROVIDER_SITE_OTHER): Payer: Managed Care, Other (non HMO)

## 2018-11-30 ENCOUNTER — Ambulatory Visit (INDEPENDENT_AMBULATORY_CARE_PROVIDER_SITE_OTHER): Payer: Self-pay | Admitting: Podiatry

## 2018-11-30 ENCOUNTER — Encounter: Payer: Self-pay | Admitting: Podiatry

## 2018-11-30 ENCOUNTER — Other Ambulatory Visit: Payer: Self-pay

## 2018-11-30 VITALS — Temp 98.2°F

## 2018-11-30 DIAGNOSIS — M2042 Other hammer toe(s) (acquired), left foot: Secondary | ICD-10-CM

## 2018-11-30 DIAGNOSIS — Z9889 Other specified postprocedural states: Secondary | ICD-10-CM

## 2018-11-30 NOTE — Progress Notes (Signed)
She presents today postop visit #1 date of surgery 11/25/2018 status post hammertoe repair fourth left a flexor tenotomy third left and an injection to the right heel with Kenalog and local anesthetic.  She states that she did very well other than the nausea and the vomiting that come along with the anesthesia and static.  Objective: Vital signs are stable she is alert and oriented x3 dressed her dressing intact was removed demonstrates no erythema edema cellulitis drainage or odor sutures are intact margins well coapted radiograph demonstrates nice arthroplasty.  Assessment: Well-healing surgical foot left.  Plan: Redressed today dressed a compressive dressing follow-up with her in a week or 2 for suture removal.

## 2018-12-07 ENCOUNTER — Ambulatory Visit (INDEPENDENT_AMBULATORY_CARE_PROVIDER_SITE_OTHER): Payer: Managed Care, Other (non HMO) | Admitting: Podiatry

## 2018-12-07 ENCOUNTER — Other Ambulatory Visit: Payer: Self-pay

## 2018-12-07 VITALS — Temp 98.5°F

## 2018-12-07 DIAGNOSIS — Z9889 Other specified postprocedural states: Secondary | ICD-10-CM

## 2018-12-07 NOTE — Progress Notes (Signed)
She presents today for her second postop visit date of surgery 11/25/2018 status post hammertoe repair fourth left states that is doing great I have no problems discomfort every once in a while other than that nothing.  Objective: Vital signs are stable she is alert and oriented x3.  Pulses are palpable.  Sutures are intact fourth toe left and plantar aspect of the third toe left all the sutures removed today margins remain well coapted there is no signs of infection.  Assessment: Well-healing surgical foot.  Plan: Instructed her on how to wrap the toe on a regular basis she will continue to do so and I will follow-up with her in 2 weeks

## 2018-12-08 ENCOUNTER — Encounter: Payer: Self-pay | Admitting: Podiatry

## 2018-12-21 ENCOUNTER — Ambulatory Visit (INDEPENDENT_AMBULATORY_CARE_PROVIDER_SITE_OTHER): Payer: Managed Care, Other (non HMO)

## 2018-12-21 ENCOUNTER — Other Ambulatory Visit: Payer: Self-pay

## 2018-12-21 ENCOUNTER — Ambulatory Visit (INDEPENDENT_AMBULATORY_CARE_PROVIDER_SITE_OTHER): Payer: Managed Care, Other (non HMO) | Admitting: Podiatry

## 2018-12-21 ENCOUNTER — Encounter: Payer: Self-pay | Admitting: Podiatry

## 2018-12-21 VITALS — Temp 98.7°F

## 2018-12-21 DIAGNOSIS — M2042 Other hammer toe(s) (acquired), left foot: Secondary | ICD-10-CM | POA: Diagnosis not present

## 2018-12-21 DIAGNOSIS — Z9889 Other specified postprocedural states: Secondary | ICD-10-CM

## 2018-12-21 NOTE — Progress Notes (Signed)
She presents today not quite 1 month status post hammertoe repair fourth left.  States that seems to be doing better all the time is a little bit sore since I walked and flip-flops at the beach this weekend.  Objective: Vital signs are stable alert and oriented x3.  Pulses are palpable.  Mild edema about the fourth toe left foot.  To be in a good rectus position however.  Assessment: Well-healing surgical toe.  Plan: Continue to tape the toe in a regular basis.  Would like to follow-up with her in about 2 to 4 weeks.

## 2019-01-04 ENCOUNTER — Other Ambulatory Visit: Payer: Self-pay

## 2019-01-04 ENCOUNTER — Encounter: Payer: Self-pay | Admitting: Podiatry

## 2019-01-04 ENCOUNTER — Ambulatory Visit (INDEPENDENT_AMBULATORY_CARE_PROVIDER_SITE_OTHER): Payer: Managed Care, Other (non HMO) | Admitting: Podiatry

## 2019-01-04 ENCOUNTER — Ambulatory Visit (INDEPENDENT_AMBULATORY_CARE_PROVIDER_SITE_OTHER): Payer: Managed Care, Other (non HMO)

## 2019-01-04 DIAGNOSIS — Z9889 Other specified postprocedural states: Secondary | ICD-10-CM

## 2019-01-04 DIAGNOSIS — M2042 Other hammer toe(s) (acquired), left foot: Secondary | ICD-10-CM

## 2019-01-04 NOTE — Progress Notes (Signed)
She presents today for postop visit date of surgery 11/25/2018 for hammertoe repair fourth left.  States that I pushed off in bed the other day with it and it bothered not toe a lot it was very painful.  Objective: Vital signs are stable alert and oriented x3 there is minimal edema no erythema cellulitis drainage or odor the position of the toe looks much better.  Radiographs taken demonstrate a well-healing arthroplasty.  Assessment: Well-healing arthroplasty fourth toe left foot.  Plan: Encouraged her to continue to wrap the toe daily provided her with Covan.

## 2019-02-01 ENCOUNTER — Ambulatory Visit (INDEPENDENT_AMBULATORY_CARE_PROVIDER_SITE_OTHER): Payer: Managed Care, Other (non HMO) | Admitting: Podiatry

## 2019-02-01 ENCOUNTER — Ambulatory Visit (INDEPENDENT_AMBULATORY_CARE_PROVIDER_SITE_OTHER): Payer: Managed Care, Other (non HMO)

## 2019-02-01 ENCOUNTER — Other Ambulatory Visit: Payer: Self-pay

## 2019-02-01 ENCOUNTER — Encounter: Payer: Self-pay | Admitting: Podiatry

## 2019-02-01 DIAGNOSIS — M76812 Anterior tibial syndrome, left leg: Secondary | ICD-10-CM | POA: Diagnosis not present

## 2019-02-01 DIAGNOSIS — Z9889 Other specified postprocedural states: Secondary | ICD-10-CM

## 2019-02-01 DIAGNOSIS — M2042 Other hammer toe(s) (acquired), left foot: Secondary | ICD-10-CM | POA: Diagnosis not present

## 2019-02-01 NOTE — Progress Notes (Signed)
She presents today for follow-up of her hammertoe fourth left states that she twisted her left foot.  Hearing a popping noise 2 days ago.  She denies fever chills nausea vomiting muscle aches and pains states that that is only tender on the outside.  She does relate some tenderness to the medial aspect of the right foot and she noticed that after she went for a long walk on Saturday.  Objective: Vital signs are stable she is alert and oriented x3.  Left foot appears to be doing fine ankle joint has some tenderness of the tibialis anterior on the right however the left does demonstrate some tenderness on palpation of the anterior talofibular ligament.  Radiographs of the left foot do not demonstrate any type of fractures or abnormalities.  Assessment: Well-healing surgical foot.  Sprained ankle grade 1 left.  Tibialis anterior tendinitis right.  Plan: Told her to take Aleve twice a day 2 tablets once in the morning once at night with food and to ice the tibialis anterior tendon as well as the anterior talofibular ligament where I demonstrated to her today.  We talked about compression ice elevation and rest she understands this is amenable to it we will follow-up with me on an as-needed basis.

## 2019-02-20 ENCOUNTER — Other Ambulatory Visit: Payer: Self-pay

## 2019-02-20 ENCOUNTER — Ambulatory Visit (INDEPENDENT_AMBULATORY_CARE_PROVIDER_SITE_OTHER): Payer: Managed Care, Other (non HMO)

## 2019-02-20 DIAGNOSIS — Z23 Encounter for immunization: Secondary | ICD-10-CM | POA: Diagnosis not present

## 2019-03-23 ENCOUNTER — Ambulatory Visit: Payer: Self-pay | Admitting: Family Medicine

## 2019-03-24 ENCOUNTER — Encounter: Payer: Self-pay | Admitting: Family Medicine

## 2019-03-24 ENCOUNTER — Ambulatory Visit (INDEPENDENT_AMBULATORY_CARE_PROVIDER_SITE_OTHER): Payer: Managed Care, Other (non HMO) | Admitting: Family Medicine

## 2019-03-24 ENCOUNTER — Other Ambulatory Visit: Payer: Self-pay

## 2019-03-24 VITALS — BP 132/70 | HR 80 | Ht 65.0 in | Wt 201.0 lb

## 2019-03-24 DIAGNOSIS — S4351XA Sprain of right acromioclavicular joint, initial encounter: Secondary | ICD-10-CM

## 2019-03-24 DIAGNOSIS — M778 Other enthesopathies, not elsewhere classified: Secondary | ICD-10-CM | POA: Diagnosis not present

## 2019-03-24 MED ORDER — MELOXICAM 15 MG PO TABS: 15.0000 mg | ORAL_TABLET | Freq: Every day | ORAL | 0 refills | Status: DC

## 2019-03-24 NOTE — Patient Instructions (Signed)
Mediterranean Diet A Mediterranean diet refers to food and lifestyle choices that are based on the traditions of countries located on the [place]. This way of eating has been shown to help prevent certain conditions and improve outcomes for people who have chronic diseases, like kidney disease and heart disease. What are tips for following this plan? Lifestyle  Cook and eat meals together with your family, when possible.  Drink enough fluid to keep your urine clear or pale yellow.  Be physically active every day. This includes: ? Aerobic exercise like running or swimming. ? Leisure activities like gardening, walking, or housework.  Get 7-8 hours of sleep each night.  If recommended by your health care provider, drink red wine in moderation. This means 1 glass a day for nonpregnant women and 2 glasses a day for men. A glass of wine equals 5 oz (150 mL). Reading food labels   Check the serving size of packaged foods. For foods such as rice and pasta, the serving size refers to the amount of cooked product, not dry.  Check the total fat in packaged foods. Avoid foods that have saturated fat or trans fats.  Check the ingredients list for added sugars, such as corn syrup. Shopping  At the grocery store, buy most of your food from the areas near the walls of the store. This includes: ? Fresh fruits and vegetables (produce). ? Grains, beans, nuts, and seeds. Some of these may be available in unpackaged forms or large amounts (in bulk). ? Fresh seafood. ? Poultry and eggs. ? Low-fat dairy products.  Buy whole ingredients instead of prepackaged foods.  Buy fresh fruits and vegetables in-season from local farmers markets.  Buy frozen fruits and vegetables in resealable bags.  If you do not have access to quality fresh seafood, buy precooked frozen shrimp or canned fish, such as tuna, salmon, or sardines.  Buy small amounts of raw or cooked vegetables, salads, or olives from  the deli or salad bar at your store.  Stock your pantry so you always have certain foods on hand, such as olive oil, canned tuna, canned tomatoes, rice, pasta, and beans. Cooking  Cook foods with extra-virgin olive oil instead of using butter or other vegetable oils.  Have meat as a side dish, and have vegetables or grains as your main dish. This means having meat in small portions or adding small amounts of meat to foods like pasta or stew.  Use beans or vegetables instead of meat in common dishes like chili or lasagna.  Experiment with different cooking methods. Try roasting or broiling vegetables instead of steaming or sauteing them.  Add frozen vegetables to soups, stews, pasta, or rice.  Add nuts or seeds for added healthy fat at each meal. You can add these to yogurt, salads, or vegetable dishes.  Marinate fish or vegetables using olive oil, lemon juice, garlic, and fresh herbs. Meal planning   Plan to eat 1 vegetarian meal one day each week. Try to work up to 2 vegetarian meals, if possible.  Eat seafood 2 or more times a week.  Have healthy snacks readily available, such as: ? Vegetable sticks with hummus. ? Greek yogurt. ? Fruit and nut trail mix.  Eat balanced meals throughout the week. This includes: ? Fruit: 2-3 servings a day ? Vegetables: 4-5 servings a day ? Low-fat dairy: 2 servings a day ? Fish, poultry, or lean meat: 1 serving a day ? Beans and legumes: 2 or more servings a week ?   Nuts and seeds: 1-2 servings a day ? Whole grains: 6-8 servings a day ? Extra-virgin olive oil: 3-4 servings a day  Limit red meat and sweets to only a few servings a month What are my food choices?  Mediterranean diet ? Recommended  Grains: Whole-grain pasta. Brown rice. Bulgar wheat. Polenta. Couscous. Whole-wheat bread. Oatmeal. Quinoa.  Vegetables: Artichokes. Beets. Broccoli. Cabbage. Carrots. Eggplant. Green beans. Chard. Kale. Spinach. Onions. Leeks. Peas. Squash.  Tomatoes. Peppers. Radishes.  Fruits: Apples. Apricots. Avocado. Berries. Bananas. Cherries. Dates. Figs. Grapes. Lemons. Melon. Oranges. Peaches. Plums. Pomegranate.  Meats and other protein foods: Beans. Almonds. Sunflower seeds. Pine nuts. Peanuts. Cod. Salmon. Scallops. Shrimp. Tuna. Tilapia. Clams. Oysters. Eggs.  Dairy: Low-fat milk. Cheese. Greek yogurt.  Beverages: Water. Red wine. Herbal tea.  Fats and oils: Extra virgin olive oil. Avocado oil. Grape seed oil.  Sweets and desserts: Greek yogurt with honey. Baked apples. Poached pears. Trail mix.  Seasoning and other foods: Basil. Cilantro. Coriander. Cumin. Mint. Parsley. Sage. Rosemary. Tarragon. Garlic. Oregano. Thyme. Pepper. Balsalmic vinegar. Tahini. Hummus. Tomato sauce. Olives. Mushrooms. ? Limit these  Grains: Prepackaged pasta or rice dishes. Prepackaged cereal with added sugar.  Vegetables: Deep fried potatoes (french fries).  Fruits: Fruit canned in syrup.  Meats and other protein foods: Beef. Pork. Lamb. Poultry with skin. Hot dogs. Bacon.  Dairy: Ice cream. Sour cream. Whole milk.  Beverages: Juice. Sugar-sweetened soft drinks. Beer. Liquor and spirits.  Fats and oils: Butter. Canola oil. Vegetable oil. Beef fat (tallow). Lard.  Sweets and desserts: Cookies. Cakes. Pies. Candy.  Seasoning and other foods: Mayonnaise. Premade sauces and marinades. The items listed may not be a complete list. Talk with your dietitian about what dietary choices are right for you. Summary  The Mediterranean diet includes both food and lifestyle choices.  Eat a variety of fresh fruits and vegetables, beans, nuts, seeds, and whole grains.  Limit the amount of red meat and sweets that you eat.  Talk with your health care provider about whether it is safe for you to drink red wine in moderation. This means 1 glass a day for nonpregnant women and 2 glasses a day for men. A glass of wine equals 5 oz (150 mL). This information  is not intended to replace advice given to you by your health care provider. Make sure you discuss any questions you have with your health care provider. Document Released: 12/19/2015 Document Revised: 12/26/2015 Document Reviewed: 12/19/2015 Elsevier Patient Education  2020 [employer]  

## 2019-03-24 NOTE — Progress Notes (Signed)
Date:  03/24/2019   Name:  Tamara [name]   DOB:  [birth year]   MRN:  [id number]   Chief Complaint: Shoulder Pain (R) shoulder pain- started Monday after the dog pulled/ jerked the leash from her)  Shoulder Pain  The pain is present in the right shoulder. This is a new problem. The current episode started in the past 7 days (monday). The problem occurs constantly. The problem has been gradually worsening. The pain is at a severity of 4/10. The pain is moderate. Pertinent negatives include no fever, inability to bear weight, itching, joint locking, joint swelling, limited range of motion, numbness, stiffness or tingling. The symptoms are aggravated by activity. She has tried NSAIDS for the symptoms. The treatment provided moderate relief.    Lab Results  Component Value Date   CREATININE 0.70 06/08/2018   BUN 13 06/08/2018   NA 138 06/08/2018   K 4.0 06/08/2018   CL 104 06/08/2018   CO2 26 06/08/2018   No results found for: CHOL, HDL, LDLCALC, LDLDIRECT, TRIG, CHOLHDL Lab Results  Component Value Date   TSH 1.060 01/23/2016   No results found for: HGBA1C   Review of Systems  Constitutional: Negative for chills and fever.  HENT: Negative for drooling, ear discharge, ear pain and sore throat.   Respiratory: Negative for cough, shortness of breath and wheezing.   Cardiovascular: Negative for chest pain, palpitations and leg swelling.  Gastrointestinal: Negative for abdominal pain, blood in stool, constipation, diarrhea and nausea.  Endocrine: Negative for polydipsia.  Genitourinary: Negative for dysuria, frequency, hematuria and urgency.  Musculoskeletal: Positive for arthralgias and myalgias. Negative for back pain, neck pain and stiffness.  Skin: Negative for itching and rash.  Allergic/Immunologic: Negative for environmental allergies.  Neurological: Negative for dizziness, tingling, numbness and headaches.  Hematological: Does not bruise/bleed easily.   Psychiatric/Behavioral: Negative for suicidal ideas. The patient is not nervous/anxious.     Patient Active Problem List   Diagnosis Date Noted  . Atypical chest pain 06/09/2018  . Elevated liver enzymes 06/09/2018  . Epigastric pain 06/09/2018  . Globus sensation 06/09/2018  . NAFLD (nonalcoholic fatty liver disease) 65/07/5463  . Primary osteoarthritis of right knee 10/01/2015    No Known Allergies  Past Surgical History:  Procedure Laterality Date  . CESAREAN SECTION     X2  . CHOLECYSTECTOMY N/A 07/28/2018   Procedure: LAPAROSCOPIC CHOLECYSTECTOMY;  Surgeon: Sung Amabile, DO;  Location: [facility] ORS;  Service: General;  Laterality: N/A;  . DILATION AND CURETTAGE OF UTERUS    . ESOPHAGOGASTRODUODENOSCOPY  2020  . HERNIA REPAIR  1990'S  . Left Knee Arthroscopy      Social History   Tobacco Use  . Smoking status: Never Smoker  . Smokeless tobacco: Never Used  Substance Use Topics  . Alcohol use: Yes    Frequency: Never    Comment: RARE  . Drug use: No     Medication list has been reviewed and updated.  Current Meds  Medication Sig  . pantoprazole (PROTONIX) 40 MG tablet Take 40 mg by mouth as needed.     PHQ 2/9 Scores 03/24/2019 11/10/2017 07/05/2017  PHQ - 2 Score 0 0 0  PHQ- 9 Score 0 0 -    BP Readings from Last 3 Encounters:  03/24/19 132/70  10/17/18 116/61  07/28/18 131/66    Physical Exam Vitals signs and nursing note reviewed.  Constitutional:      Appearance: She is well-developed.  HENT:  Head: Normocephalic.     Right Ear: Tympanic membrane, ear canal and external ear normal.     Left Ear: Tympanic membrane, ear canal and external ear normal.     Nose: Nose normal.  Eyes:     General: Lids are everted, no foreign bodies appreciated. No scleral icterus.       Left eye: No foreign body or hordeolum.     Conjunctiva/sclera: Conjunctivae normal.     Right eye: Right conjunctiva is not injected.     Left eye: Left conjunctiva is not  injected.     Pupils: Pupils are equal, round, and reactive to light.  Neck:     Musculoskeletal: Normal range of motion and neck supple.     Thyroid: No thyromegaly.     Vascular: No JVD.     Trachea: No tracheal deviation.  Cardiovascular:     Rate and Rhythm: Normal rate and regular rhythm.     Heart sounds: Normal heart sounds. No murmur. No friction rub. No gallop.   Pulmonary:     Effort: Pulmonary effort is normal. No respiratory distress.     Breath sounds: Normal breath sounds. No wheezing or rales.  Abdominal:     General: Bowel sounds are normal.     Palpations: Abdomen is soft. There is no mass.     Tenderness: There is no abdominal tenderness. There is no guarding or rebound.  Musculoskeletal:     Right shoulder: She exhibits decreased range of motion and tenderness. She exhibits no bony tenderness, no swelling, no effusion, no crepitus and no spasm.  Lymphadenopathy:     Cervical: No cervical adenopathy.  Skin:    General: Skin is warm.     Findings: No rash.  Neurological:     Mental Status: She is alert and oriented to person, place, and time.     Cranial Nerves: No cranial nerve deficit.     Deep Tendon Reflexes: Reflexes normal.  Psychiatric:        Mood and Affect: Mood is not anxious or depressed.     Wt Readings from Last 3 Encounters:  03/24/19 201 lb (91.2 kg)  07/28/18 199 lb (90.3 kg)  05/19/18 202 lb (91.6 kg)    BP 132/70   Pulse 80   Ht 5\' 5"  (1.651 m)   Wt 201 lb (91.2 kg)   SpO2 98%   BMI 33.45 kg/m   Assessment and Plan:  1. Tendonitis of shoulder, right Patient allowed her megapoodle to pull her on a leash in the jerked her shoulder and sustained pain in her right shoulder which is exacerbated by motion there is no palpable defect.  Will initiate Mobic 15 mg once a day and refer to orthopedics for evaluation and treatment - meloxicam (MOBIC) 15 MG tablet; Take 1 tablet (15 mg total) by mouth daily.  Dispense: 30 tablet; Refill: 0 -  Ambulatory referral to Orthopedic Surgery  2. Sprain of right acromioclavicular ligament, initial encounter Pain is tenderness over the acromioclavicular ligament although the mechanism of injury which suggests that it is more of a muscular.  As noted above will refer to Ortho and initiate meloxicam. - meloxicam (MOBIC) 15 MG tablet; Take 1 tablet (15 mg total) by mouth daily.  Dispense: 30 tablet; Refill: 0 - Ambulatory referral to Orthopedic Surgery

## 2019-03-29 ENCOUNTER — Encounter: Payer: Self-pay | Admitting: Podiatry

## 2019-03-29 ENCOUNTER — Other Ambulatory Visit: Payer: Self-pay

## 2019-03-29 ENCOUNTER — Ambulatory Visit (INDEPENDENT_AMBULATORY_CARE_PROVIDER_SITE_OTHER): Payer: Managed Care, Other (non HMO) | Admitting: Podiatry

## 2019-03-29 ENCOUNTER — Ambulatory Visit (INDEPENDENT_AMBULATORY_CARE_PROVIDER_SITE_OTHER): Payer: Managed Care, Other (non HMO)

## 2019-03-29 DIAGNOSIS — M7752 Other enthesopathy of left foot: Secondary | ICD-10-CM

## 2019-03-29 DIAGNOSIS — S90122A Contusion of left lesser toe(s) without damage to nail, initial encounter: Secondary | ICD-10-CM | POA: Diagnosis not present

## 2019-03-29 NOTE — Progress Notes (Signed)
She presents today states that her daughters dog stepped on the fourth toe of the left foot which is this is a same toe she continues to reinjure which had an arthroplasty at the [facility] several months ago now she states that is been hurting ever since the dog stepped on it.  She has not tried to treat it.  Objective: Vital signs are stable she is alert and oriented x3 the toe is mildly edematous there is no erythema no cellulitis drainage or odor lateral aspect of the toe does demonstrate a little bit of thick scar tissue which appears to be a little thicker than it was last time I saw and a little more tender.  Radiographs taken today do not demonstrate any type of acute finding.  Assessment: Contusion toe fourth left status post arthroplasty.  Plan: Discussed etiology pathology conservative versus surgical therapies at this point we will continue to wrap the toe with Coban until the tenderness has resolved.

## 2019-04-27 ENCOUNTER — Other Ambulatory Visit: Payer: Self-pay | Admitting: Family Medicine

## 2019-04-27 DIAGNOSIS — M778 Other enthesopathies, not elsewhere classified: Secondary | ICD-10-CM

## 2019-04-27 DIAGNOSIS — S4351XA Sprain of right acromioclavicular joint, initial encounter: Secondary | ICD-10-CM

## 2019-10-03 ENCOUNTER — Ambulatory Visit: Payer: Managed Care, Other (non HMO) | Admitting: Family Medicine

## 2019-10-03 ENCOUNTER — Encounter: Payer: Self-pay | Admitting: Family Medicine

## 2019-10-03 ENCOUNTER — Ambulatory Visit: Payer: Self-pay

## 2019-10-03 ENCOUNTER — Other Ambulatory Visit: Payer: Self-pay

## 2019-10-03 VITALS — BP 132/82 | HR 67 | Temp 98.2°F | Ht 65.0 in | Wt 209.0 lb

## 2019-10-03 DIAGNOSIS — H6993 Unspecified Eustachian tube disorder, bilateral: Secondary | ICD-10-CM | POA: Diagnosis not present

## 2019-10-03 DIAGNOSIS — H8110 Benign paroxysmal vertigo, unspecified ear: Secondary | ICD-10-CM | POA: Diagnosis not present

## 2019-10-03 MED ORDER — MECLIZINE HCL 25 MG PO TABS: 25.0000 mg | ORAL_TABLET | Freq: Three times a day (TID) | ORAL | 2 refills | Status: DC | PRN

## 2019-10-03 NOTE — Progress Notes (Signed)
Date:  10/03/2019   Name:  Tamara [name]   DOB:  [birth year]   MRN:  [id number]   Chief Complaint: Dizziness (X 2 days. Had vertigo in March and pt said this does feel the same. She has dizziness and nausea. )  Dizziness This is a recurrent (previous episode 3/21/[place]) problem. The current episode started more than 1 year ago. The problem occurs intermittently. The problem has been waxing and waning. Associated symptoms include nausea and vertigo. Pertinent negatives include no abdominal pain, arthralgias, chills, congestion, coughing, diaphoresis, fatigue, fever, headaches, myalgias, numbness, rash, sore throat, vomiting or weakness. The symptoms are aggravated by bending and twisting (recent flight to [employer]). Treatments tried: dramamine. The treatment provided moderate relief.    Lab Results  Component Value Date   CREATININE 0.70 06/08/2018   BUN 13 06/08/2018   NA 138 06/08/2018   K 4.0 06/08/2018   CL 104 06/08/2018   CO2 26 06/08/2018   No results found for: CHOL, HDL, LDLCALC, LDLDIRECT, TRIG, CHOLHDL Lab Results  Component Value Date   TSH 1.060 01/23/2016   No results found for: HGBA1C Lab Results  Component Value Date   WBC 4.7 06/08/2018   HGB 13.1 06/08/2018   HCT 40.7 06/08/2018   MCV 87.2 06/08/2018   PLT 300 06/08/2018   Lab Results  Component Value Date   ALT 49 (H) 07/28/2018   AST 37 07/28/2018   ALKPHOS 62 07/28/2018   BILITOT 0.4 07/28/2018     Review of Systems  Constitutional: Negative.  Negative for chills, diaphoresis, fatigue, fever and unexpected weight change.  HENT: Negative for congestion, ear discharge, ear pain, hearing loss, postnasal drip, rhinorrhea, sinus pressure, sneezing, sore throat and tinnitus.   Eyes: Negative for photophobia, pain, discharge, redness and itching.  Respiratory: Negative for cough, shortness of breath, wheezing and stridor.   Gastrointestinal: Positive for nausea. Negative for abdominal pain, blood in  stool, constipation, diarrhea and vomiting.  Endocrine: Negative for cold intolerance, heat intolerance, polydipsia, polyphagia and polyuria.  Genitourinary: Negative for dysuria, flank pain, frequency, hematuria, menstrual problem, pelvic pain, urgency, vaginal bleeding and vaginal discharge.  Musculoskeletal: Negative for arthralgias, back pain and myalgias.  Skin: Negative for rash.  Allergic/Immunologic: Negative for environmental allergies and food allergies.  Neurological: Positive for dizziness and vertigo. Negative for weakness, light-headedness, numbness and headaches.  Hematological: Negative for adenopathy. Does not bruise/bleed easily.  Psychiatric/Behavioral: Negative for dysphoric mood. The patient is not nervous/anxious.     Patient Active Problem List   Diagnosis Date Noted  . Atypical chest pain 06/09/2018  . Elevated liver enzymes 06/09/2018  . Epigastric pain 06/09/2018  . Globus sensation 06/09/2018  . NAFLD (nonalcoholic fatty liver disease) 06/09/2018  . Primary osteoarthritis of right knee 10/01/2015    No Known Allergies  Past Surgical History:  Procedure Laterality Date  . CESAREAN SECTION     X2  . CHOLECYSTECTOMY N/A 07/28/2018   Procedure: LAPAROSCOPIC CHOLECYSTECTOMY;  Surgeon: Benjamine Sprague, DO;  Location: [facility] ORS;  Service: General;  Laterality: N/A;  . DILATION AND CURETTAGE OF UTERUS    . ESOPHAGOGASTRODUODENOSCOPY  2020  . HERNIA REPAIR  1990'S  . Left Knee Arthroscopy      Social History   Tobacco Use  . Smoking status: Never Smoker  . Smokeless tobacco: Never Used  Substance Use Topics  . Alcohol use: Yes    Comment: RARE  . Drug use: No     Medication list has been reviewed  and updated.  Current Meds  Medication Sig  . pantoprazole (PROTONIX) 40 MG tablet Take 40 mg by mouth as needed.     PHQ 2/9 Scores 10/03/2019 03/24/2019 11/10/2017 07/05/2017  PHQ - 2 Score 0 0 0 0  PHQ- 9 Score 0 0 0 -    BP Readings from Last 3  Encounters:  10/03/19 (!) 142/90  03/24/19 132/70  10/17/18 116/61    Physical Exam Vitals and nursing note reviewed.  Constitutional:      General: She is not in acute distress.    Appearance: She is not diaphoretic.  HENT:     Head: Normocephalic and atraumatic.     Right Ear: Hearing, ear canal and external ear normal. There is no impacted cerumen. Tympanic membrane is retracted.     Left Ear: Hearing, ear canal and external ear normal. There is no impacted cerumen. Tympanic membrane is retracted.     Nose: Nose normal. No congestion or rhinorrhea.     Mouth/Throat:     Mouth: Mucous membranes are moist.  Eyes:     General:        Right eye: No discharge.        Left eye: No discharge.     Conjunctiva/sclera: Conjunctivae normal.     Pupils: Pupils are equal, round, and reactive to light.  Neck:     Thyroid: No thyromegaly.     Vascular: No JVD.  Cardiovascular:     Rate and Rhythm: Normal rate and regular rhythm.     Heart sounds: Normal heart sounds. No murmur. No friction rub. No gallop.   Pulmonary:     Effort: Pulmonary effort is normal.     Breath sounds: Normal breath sounds.  Abdominal:     General: Bowel sounds are normal.     Palpations: Abdomen is soft. There is no mass.     Tenderness: There is no abdominal tenderness. There is no guarding.  Musculoskeletal:        General: Normal range of motion.     Cervical back: Normal range of motion and neck supple.  Lymphadenopathy:     Cervical: No cervical adenopathy.  Skin:    General: Skin is warm and dry.  Neurological:     Mental Status: She is alert.     Deep Tendon Reflexes: Reflexes are normal and symmetric.     Wt Readings from Last 3 Encounters:  10/03/19 209 lb (94.8 kg)  03/24/19 201 lb (91.2 kg)  07/28/18 199 lb (90.3 kg)    BP (!) 142/90   Pulse 67   Temp 98.2 F (36.8 C) (Oral)   Ht 5\' 5"  (1.651 m)   Wt 209 lb (94.8 kg)   SpO2 100%   BMI 34.78 kg/m   Assessment and Plan:  1.  Benign paroxysmal positional vertigo, unspecified laterality New onset. This is a recurrence from March. This developed after patient travel began to [facility] by airplane and developed the symptoms shortly thereafter. For vertigo-like dizziness which is exacerbated by head movement. Some control with Dramamine but we will start with meclizine 25 mg every 8 hours as needed for dizziness. Patient is also been given some Epley maneuvers to be used for positional getting the labyrinthine's to settle.  2. Disorder of both eustachian tubes Patient has bilateral retraction of both tympanic membranes. Patient is able to clear her ears bilateral. I have suggested that patient get some Nasonex to use to open up eustachian tubes which may be the  source of her vertigo.

## 2019-10-03 NOTE — Telephone Encounter (Signed)
Pt. Reports she had been to [place] over the weekend. Began having dizziness Sunday night after flying home. Worse with changing positions. Has vertigo when laying down. Nausea at times. Has taken OTC Dramamine. Appointment made.  Reason for Disposition . [1] MODERATE dizziness (e.g., interferes with normal activities) AND [2] has NOT been evaluated by physician for this  (Exception: dizziness caused by heat exposure, sudden standing, or poor fluid intake)  Answer Assessment - Initial Assessment Questions 1. DESCRIPTION: "Describe your dizziness."     Lightheadedness  2. LIGHTHEADED: "Do you feel lightheaded?" (e.g., somewhat faint, woozy, weak upon standing)     Standing up 3. VERTIGO: "Do you feel like either you or the room is spinning or tilting?" (i.e. vertigo)     Yes 4. SEVERITY: "How bad is it?"  "Do you feel like you are going to faint?" "Can you stand and walk?"   - MILD - walking normally   - MODERATE - interferes with normal activities (e.g., work, school)    - SEVERE - unable to stand, requires support to walk, feels like passing out now.      Moderate 5. ONSET:  "When did the dizziness begin?"     Sunday 6. AGGRAVATING FACTORS: "Does anything make it worse?" (e.g., standing, change in head position)     Poistions 7. HEART RATE: "Can you tell me your heart rate?" "How many beats in 15 seconds?"  (Note: not all patients can do this)       No 8. CAUSE: "What do you think is causing the dizziness?"     Unsure 9. RECURRENT SYMPTOM: "Have you had dizziness before?" If so, ask: "When was the last time?" "What happened that time?"     Yes 10. OTHER SYMPTOMS: "Do you have any other symptoms?" (e.g., fever, chest pain, vomiting, diarrhea, bleeding)       Nausea 11. PREGNANCY: "Is there any chance you are pregnant?" "When was your last menstrual period?"       No  Protocols used: DIZZINESS [facility]

## 2019-10-03 NOTE — Patient Instructions (Addendum)
How to Perform the Epley Maneuver The Epley maneuver is an exercise that relieves symptoms of vertigo. Vertigo is the feeling that you or your surroundings are moving when they are not. When you feel vertigo, you may feel like the room is spinning and have trouble walking. Dizziness is a little different than vertigo. When you are dizzy, you may feel unsteady or light-headed. You can do this maneuver at home whenever you have symptoms of vertigo. You can do it up to 3 times a day until your symptoms go away. Even though the Epley maneuver may relieve your vertigo for a few weeks, it is possible that your symptoms will return. This maneuver relieves vertigo, but it does not relieve dizziness. What are the risks? If it is done correctly, the Epley maneuver is considered safe. Sometimes it can lead to dizziness or nausea that goes away after a short time. If you develop other symptoms, such as changes in vision, weakness, or numbness, stop doing the maneuver and call your health care provider. How to perform the Epley maneuver 1. Sit on the edge of a bed or table with your back straight and your legs extended or hanging over the edge of the bed or table. 2. Turn your head halfway toward the affected ear or side. 3. Lie backward quickly with your head turned until you are lying flat on your back. You may want to position a pillow under your shoulders. 4. Hold this position for 30 seconds. You may experience an attack of vertigo. This is normal. 5. Turn your head to the opposite direction until your unaffected ear is facing the floor. 6. Hold this position for 30 seconds. You may experience an attack of vertigo. This is normal. Hold this position until the vertigo stops. 7. Turn your whole body to the same side as your head. Hold for another 30 seconds. 8. Sit back up. You can repeat this exercise up to 3 times a day. Follow these instructions at home:  After doing the Epley maneuver, you can return to  your normal activities.  Ask your health care provider if there is anything you should do at home to prevent vertigo. He or she may recommend that you: ? Keep your head raised (elevated) with two or more pillows while you sleep. ? Do not sleep on the side of your affected ear. ? Get up slowly from bed. ? Avoid sudden movements during the day. ? Avoid extreme head movement, like looking up or bending over. Contact a health care provider if:  Your vertigo gets worse.  You have other symptoms, including: ? Nausea. ? Vomiting. ? Headache. Get help right away if:  You have vision changes.  You have a severe or worsening headache or neck pain.  You cannot stop vomiting.  You have new numbness or weakness in any part of your body. Summary  Vertigo is the feeling that you or your surroundings are moving when they are not.  The Epley maneuver is an exercise that relieves symptoms of vertigo.  If the Epley maneuver is done correctly, it is considered safe. You can do it up to 3 times a day. This information is not intended to replace advice given to you by your health care provider. Make sure you discuss any questions you have with your health care provider. Document Revised: 04/09/2017 Document Reviewed: 03/17/2016 Elsevier Patient Education  2020 Else[employer] Inc.  Benign Positional Vertigo Vertigo is the feeling that you or your surroundings are moving  when they are not. Benign positional vertigo is the most common form of vertigo. This is usually a harmless condition (benign). This condition is positional. This means that symptoms are triggered by certain movements and positions. This condition can be dangerous if it occurs while you are doing something that could cause harm to you or others. This includes activities such as driving or operating machinery. What are the causes? In many cases, the cause of this condition is not known. It may be caused by a disturbance in an area of the  inner ear that helps your brain to sense movement and balance. This disturbance can be caused by:  Viral infection (labyrinthitis).  Head injury.  Repetitive motion, such as jumping, dancing, or running. What increases the risk? You are more likely to develop this condition if:  You are a woman.  You are 48 years of age or older. What are the signs or symptoms? Symptoms of this condition usually happen when you move your head or your eyes in different directions. Symptoms may start suddenly, and usually last for less than a minute. They include:  Loss of balance and falling.  Feeling like you are spinning or moving.  Feeling like your surroundings are spinning or moving.  Nausea and vomiting.  Blurred vision.  Dizziness.  Involuntary eye movement (nystagmus). Symptoms can be mild and cause only minor problems, or they can be severe and interfere with daily life. Episodes of benign positional vertigo may return (recur) over time. Symptoms may improve over time. How is this diagnosed? This condition may be diagnosed based on:  Your medical history.  Physical exam of the head, neck, and ears.  Tests, such as: ? MRI. ? CT scan. ? Eye movement tests. Your health care provider may ask you to change positions quickly while he or she watches you for symptoms of benign positional vertigo, such as nystagmus. Eye movement may be tested with a variety of exams that are designed to evaluate or stimulate vertigo. ? An electroencephalogram (EEG). This records electrical activity in your brain. ? Hearing tests. You may be referred to a health care provider who specializes in ear, nose, and throat (ENT) problems (otolaryngologist) or a provider who specializes in disorders of the nervous system (neurologist). How is this treated?  This condition may be treated in a session in which your health care provider moves your head in specific positions to adjust your inner ear back to normal.  Treatment for this condition may take several sessions. Surgery may be needed in severe cases, but this is rare. In some cases, benign positional vertigo may resolve on its own in 2-4 weeks. Follow these instructions at home: Safety  Move slowly. Avoid sudden body or head movements or certain positions, as told by your health care provider.  Avoid driving until your health care provider says it is safe for you to do so.  Avoid operating heavy machinery until your health care provider says it is safe for you to do so.  Avoid doing any tasks that would be dangerous to you or others if vertigo occurs.  If you have trouble walking or keeping your balance, try using a cane for stability. If you feel dizzy or unstable, sit down right away.  Return to your normal activities as told by your health care provider. Ask your health care provider what activities are safe for you. General instructions  Take over-the-counter and prescription medicines only as told by your health care provider.  Drink  enough fluid to keep your urine pale yellow.  Keep all follow-up visits as told by your health care provider. This is important. Contact a health care provider if:  You have a fever.  Your condition gets worse or you develop new symptoms.  Your family or friends notice any behavioral changes.  You have nausea or vomiting that gets worse.  You have numbness or a "pins and needles" sensation. Get help right away if you:  Have difficulty speaking or moving.  Are always dizzy.  Faint.  Develop severe headaches.  Have weakness in your legs or arms.  Have changes in your hearing or vision.  Develop a stiff neck.  Develop sensitivity to light. Summary  Vertigo is the feeling that you or your surroundings are moving when they are not. Benign positional vertigo is the most common form of vertigo.  The cause of this condition is not known. It may be caused by a disturbance in an area of  the inner ear that helps your brain to sense movement and balance.  Symptoms include loss of balance and falling, feeling that you or your surroundings are moving, nausea and vomiting, and blurred vision.  This condition can be diagnosed based on symptoms, physical exam, and other tests, such as MRI, CT scan, eye movement tests, and hearing tests.  Follow safety instructions as told by your health care provider. You will also be told when to contact your health care provider in case of problems. This information is not intended to replace advice given to you by your health care provider. Make sure you discuss any questions you have with your health care provider. Document Revised: 10/06/2017 Document Reviewed: 10/06/2017 Elsevier Patient Education  2020 Elsevier Inc.  Calorie Counting for [employer] Calories are units of energy. Your body needs a certain amount of calories from food to keep you going throughout the day. When you eat more calories than your body needs, your body stores the extra calories as fat. When you eat fewer calories than your body needs, your body burns fat to get the energy it needs. Calorie counting means keeping track of how many calories you eat and drink each day. Calorie counting can be helpful if you need to lose weight. If you make sure to eat fewer calories than your body needs, you should lose weight. Ask your health care provider what a healthy weight is for you. For calorie counting to work, you will need to eat the right number of calories in a day in order to lose a healthy amount of weight per week. A dietitian can help you determine how many calories you need in a day and will give you suggestions on how to reach your calorie goal.  A healthy amount of weight to lose per week is usually 1-2 lb (0.5-0.9 kg). This usually means that your daily calorie intake should be reduced by 500-750 calories.  Eating 1,200 - 1,500 calories per day can help most women lose  weight.  Eating 1,500 - 1,800 calories per day can help most men lose weight. What is my plan? My goal is to have __________ calories per day. If I have this many calories per day, I should lose around __________ pounds per week. What do I need to know about calorie counting? In order to meet your daily calorie goal, you will need to:  Find out how many calories are in each food you would like to eat. Try to do this before you eat.  Decide how much of the food you plan to eat.  Write down what you ate and how many calories it had. Doing this is called keeping a food log. To successfully lose weight, it is important to balance calorie counting with a healthy lifestyle that includes regular activity. Aim for 150 minutes of moderate exercise (such as walking) or 75 minutes of vigorous exercise (such as running) each week. Where do I find calorie information?  The number of calories in a food can be found on a Nutrition Facts label. If a food does not have a Nutrition Facts label, try to look up the calories online or ask your dietitian for help. Remember that calories are listed per serving. If you choose to have more than one serving of a food, you will have to multiply the calories per serving by the amount of servings you plan to eat. For example, the label on a package of bread might say that a serving size is 1 slice and that there are 90 calories in a serving. If you eat 1 slice, you will have eaten 90 calories. If you eat 2 slices, you will have eaten 180 calories. How do I keep a food log? Immediately after each meal, record the following information in your food log:  What you ate. Don't forget to include toppings, sauces, and other extras on the food.  How much you ate. This can be measured in cups, ounces, or number of items.  How many calories each food and drink had.  The total number of calories in the meal. Keep your food log near you, such as in a small notebook in your  pocket, or use a mobile app or website. Some programs will calculate calories for you and show you how many calories you have left for the day to meet your goal. What are some calorie counting tips?   Use your calories on foods and drinks that will fill you up and not leave you hungry: ? Some examples of foods that fill you up are nuts and nut butters, vegetables, lean proteins, and high-fiber foods like whole grains. High-fiber foods are foods with more than 5 g fiber per serving. ? Drinks such as sodas, specialty coffee drinks, alcohol, and juices have a lot of calories, yet do not fill you up.  Eat nutritious foods and avoid empty calories. Empty calories are calories you get from foods or beverages that do not have many vitamins or protein, such as candy, sweets, and soda. It is better to have a nutritious high-calorie food (such as an avocado) than a food with few nutrients (such as a bag of chips).  Know how many calories are in the foods you eat most often. This will help you calculate calorie counts faster.  Pay attention to calories in drinks. Low-calorie drinks include water and unsweetened drinks.  Pay attention to nutrition labels for "low fat" or "fat free" foods. These foods sometimes have the same amount of calories or more calories than the full fat versions. They also often have added sugar, starch, or salt, to make up for flavor that was removed with the fat.  Find a way of tracking calories that works for you. Get creative. Try different apps or programs if writing down calories does not work for you. What are some portion control tips?  Know how many calories are in a serving. This will help you know how many servings of a certain food you can have.  Use a  measuring cup to measure serving sizes. You could also try weighing out portions on a kitchen scale. With time, you will be able to estimate serving sizes for some foods.  Take some time to put servings of different foods  on your favorite plates, bowls, and cups so you know what a serving looks like.  Try not to eat straight from a bag or box. Doing this can lead to overeating. Put the amount you would like to eat in a cup or on a plate to make sure you are eating the right portion.  Use smaller plates, glasses, and bowls to prevent overeating.  Try not to multitask (for example, watch TV or use your computer) while eating. If it is time to eat, sit down at a table and enjoy your food. This will help you to know when you are full. It will also help you to be aware of what you are eating and how much you are eating. What are tips for following this plan? Reading food labels  Check the calorie count compared to the serving size. The serving size may be smaller than what you are used to eating.  Check the source of the calories. Make sure the food you are eating is high in vitamins and protein and low in saturated and trans fats. Shopping  Read nutrition labels while you shop. This will help you make healthy decisions before you decide to purchase your food.  Make a grocery list and stick to it. Cooking  Try to cook your favorite foods in a healthier way. For example, try baking instead of frying.  Use low-fat dairy products. Meal planning  Use more fruits and vegetables. Half of your plate should be fruits and vegetables.  Include lean proteins like poultry and fish. How do I count calories when eating out?  Ask for smaller portion sizes.  Consider sharing an entree and sides instead of getting your own entree.  If you get your own entree, eat only half. Ask for a box at the beginning of your meal and put the rest of your entree in it so you are not tempted to eat it.  If calories are listed on the menu, choose the lower calorie options.  Choose dishes that include vegetables, fruits, whole grains, low-fat dairy products, and lean protein.  Choose items that are boiled, broiled, grilled, or  steamed. Stay away from items that are buttered, battered, fried, or served with cream sauce. Items labeled "crispy" are usually fried, unless stated otherwise.  Choose water, low-fat milk, unsweetened iced tea, or other drinks without added sugar. If you want an alcoholic beverage, choose a lower calorie option such as a glass of wine or light beer.  Ask for dressings, sauces, and syrups on the side. These are usually high in calories, so you should limit the amount you eat.  If you want a salad, choose a garden salad and ask for grilled meats. Avoid extra toppings like bacon, cheese, or fried items. Ask for the dressing on the side, or ask for olive oil and vinegar or lemon to use as dressing.  Estimate how many servings of a food you are given. For example, a serving of cooked rice is  cup or about the size of half a baseball. Knowing serving sizes will help you be aware of how much food you are eating at restaurants. The list below tells you how big or small some common portion sizes are based on everyday objects: ? 1  oz--4 stacked dice. ? 3 oz--1 deck of cards. ? 1 tsp--1 die. ? 1 Tbsp-- a ping-pong ball. ? 2 Tbsp--1 ping-pong ball. ?  cup-- baseball. ? 1 cup--1 baseball. Summary  Calorie counting means keeping track of how many calories you eat and drink each day. If you eat fewer calories than your body needs, you should lose weight.  A healthy amount of weight to lose per week is usually 1-2 lb (0.5-0.9 kg). This usually means reducing your daily calorie intake by 500-750 calories.  The number of calories in a food can be found on a Nutrition Facts label. If a food does not have a Nutrition Facts label, try to look up the calories online or ask your dietitian for help.  Use your calories on foods and drinks that will fill you up, and not on foods and drinks that will leave you hungry.  Use smaller plates, glasses, and bowls to prevent overeating. This information is not  intended to replace advice given to you by your health care provider. Make sure you discuss any questions you have with your health care provider. Document Revised: 01/14/2018 Document Reviewed: 03/27/2016 Elsevier Patient Education  [employer].

## 2019-11-01 LAB — HEMOGLOBIN A1C: Hemoglobin A1C: 5.8

## 2019-11-01 LAB — BASIC METABOLIC PANEL
Creatinine: 0.8 (ref 0.5–1.1)
Glucose: 84

## 2019-11-01 LAB — LIPID PANEL
Cholesterol: 200 (ref 0–200)
HDL: 49 (ref 35–70)
LDL Cholesterol: 128
Triglycerides: 130 (ref 40–160)

## 2019-11-01 LAB — COMPREHENSIVE METABOLIC PANEL

## 2020-03-05 ENCOUNTER — Other Ambulatory Visit: Payer: Self-pay

## 2020-03-05 ENCOUNTER — Ambulatory Visit (INDEPENDENT_AMBULATORY_CARE_PROVIDER_SITE_OTHER): Payer: Managed Care, Other (non HMO)

## 2020-03-05 DIAGNOSIS — Z23 Encounter for immunization: Secondary | ICD-10-CM

## 2020-03-11 ENCOUNTER — Encounter: Payer: Self-pay | Admitting: Family Medicine

## 2020-03-11 ENCOUNTER — Telehealth: Payer: Self-pay

## 2020-03-11 NOTE — Telephone Encounter (Unsigned)
Copied from CRM [id number]. Topic: General - Inquiry >> Mar 11, 2020 10:20 AM Daphine Deutscher D wrote: Reason for CRM: Pt works at [employer] and has some lab reports for herself.  She wants to know if she needs to send them over to Dr. Yetta Barre.  She is going to try to send them through her mychart.  CB#  [phone]

## 2020-03-11 NOTE — Telephone Encounter (Signed)
done

## 2020-03-20 ENCOUNTER — Ambulatory Visit (INDEPENDENT_AMBULATORY_CARE_PROVIDER_SITE_OTHER): Payer: Managed Care, Other (non HMO)

## 2020-03-20 ENCOUNTER — Ambulatory Visit: Payer: Managed Care, Other (non HMO)

## 2020-03-20 ENCOUNTER — Other Ambulatory Visit: Payer: Self-pay

## 2020-03-20 DIAGNOSIS — Z23 Encounter for immunization: Secondary | ICD-10-CM

## 2020-03-20 NOTE — Progress Notes (Signed)
Shingles given

## 2020-04-02 ENCOUNTER — Encounter: Payer: Self-pay | Admitting: Family Medicine

## 2020-04-02 NOTE — Telephone Encounter (Signed)
Mrs. Procter called back she would like a refill for her daughter of  flonase . She says her allergies are acting up again this year. She said Dr. Yetta Barre has prescribed it for her before. Please advise

## 2020-04-08 ENCOUNTER — Other Ambulatory Visit: Payer: Self-pay

## 2020-04-08 ENCOUNTER — Ambulatory Visit: Payer: Managed Care, Other (non HMO) | Admitting: Internal Medicine

## 2020-04-08 ENCOUNTER — Encounter: Payer: Self-pay | Admitting: Internal Medicine

## 2020-04-08 VITALS — BP 136/72 | HR 66 | Temp 98.3°F | Ht 65.0 in | Wt 197.0 lb

## 2020-04-08 DIAGNOSIS — J0100 Acute maxillary sinusitis, unspecified: Secondary | ICD-10-CM | POA: Diagnosis not present

## 2020-04-08 DIAGNOSIS — R059 Cough, unspecified: Secondary | ICD-10-CM | POA: Diagnosis not present

## 2020-04-08 MED ORDER — FLUTICASONE PROPIONATE 50 MCG/ACT NA SUSP: 2.0000 | Freq: Every day | NASAL | 1 refills | Status: DC

## 2020-04-08 MED ORDER — GUAIFENESIN-CODEINE 100-10 MG/5ML PO SYRP: 5.0000 mL | ORAL_SOLUTION | Freq: Three times a day (TID) | ORAL | 0 refills | Status: DC | PRN

## 2020-04-08 MED ORDER — AZITHROMYCIN 250 MG PO TABS: ORAL_TABLET | ORAL | 0 refills | Status: AC

## 2020-04-08 NOTE — Progress Notes (Signed)
Date:  04/08/2020   Name:  Tamara [name]   DOB:  Apr 22, 1966   MRN:  [id number]   Chief Complaint: Sinusitis (Patient is seeing Dr. Judithann Graves today because Dr. Yetta Barre is on vacation this week. Patient compaining of sinus drainage, cough, and green/yellow production. No fever. )  Sinusitis This is a new problem. The current episode started in the past 7 days. The problem has been gradually worsening since onset. There has been no fever. Associated symptoms include congestion, coughing, sinus pressure and a sore throat. Pertinent negatives include no chills, headaches or shortness of breath. Past treatments include oral decongestants (cough syrup). The treatment provided mild relief.    Lab Results  Component Value Date   CREATININE 0.8 11/01/2019   BUN 13 06/08/2018   NA 138 06/08/2018   K 4.0 06/08/2018   CL 104 06/08/2018   CO2 26 06/08/2018   Lab Results  Component Value Date   CHOL 200 11/01/2019   HDL 49 11/01/2019   LDLCALC 128 11/01/2019   TRIG 130 11/01/2019   Lab Results  Component Value Date   TSH 1.060 01/23/2016   Lab Results  Component Value Date   HGBA1C 5.8 11/01/2019   Lab Results  Component Value Date   WBC 4.7 06/08/2018   HGB 13.1 06/08/2018   HCT 40.7 06/08/2018   MCV 87.2 06/08/2018   PLT 300 06/08/2018   Lab Results  Component Value Date   ALT 49 (H) 07/28/2018   AST 37 07/28/2018   ALKPHOS 62 07/28/2018   BILITOT 0.4 07/28/2018     Review of Systems  Constitutional: Positive for fatigue. Negative for chills and fever.  HENT: Positive for congestion, sinus pressure and sore throat. Negative for trouble swallowing.   Respiratory: Positive for cough. Negative for chest tightness, shortness of breath and wheezing.   Cardiovascular: Negative for chest pain, palpitations and leg swelling.  Neurological: Negative for dizziness and headaches.    Patient Active Problem List   Diagnosis Date Noted  . Atypical chest pain 06/09/2018  .  Elevated liver enzymes 06/09/2018  . Epigastric pain 06/09/2018  . Globus sensation 06/09/2018  . NAFLD (nonalcoholic fatty liver disease) 32/95/1884  . Primary osteoarthritis of right knee 10/01/2015    No Known Allergies  Past Surgical History:  Procedure Laterality Date  . CESAREAN SECTION     X2  . CHOLECYSTECTOMY N/A 07/28/2018   Procedure: LAPAROSCOPIC CHOLECYSTECTOMY;  Surgeon: Sung Amabile, DO;  Location: [facility] ORS;  Service: General;  Laterality: N/A;  . DILATION AND CURETTAGE OF UTERUS    . ESOPHAGOGASTRODUODENOSCOPY  2020  . HERNIA REPAIR  1990'S  . Left Knee Arthroscopy      Social History   Tobacco Use  . Smoking status: Never Smoker  . Smokeless tobacco: Never Used  Vaping Use  . Vaping Use: Never used  Substance Use Topics  . Alcohol use: Yes    Comment: RARE  . Drug use: No     Medication list has been reviewed and updated.  Current Meds  Medication Sig  . pantoprazole (PROTONIX) 40 MG tablet Take 40 mg by mouth as needed.     PHQ 2/9 Scores 04/08/2020 10/03/2019 03/24/2019 11/10/2017  PHQ - 2 Score 0 0 0 0  PHQ- 9 Score 0 0 0 0    GAD 7 : Generalized Anxiety Score 04/08/2020 10/03/2019 03/24/2019  Nervous, Anxious, on Edge 0 0 0  Control/stop worrying 0 0 0  Worry too much - different  things 0 0 0  Trouble relaxing 0 0 0  Restless 0 0 0  Easily annoyed or irritable 0 0 0  Afraid - awful might happen 0 0 0  Total GAD 7 Score 0 0 0  Anxiety Difficulty Not difficult at all Not difficult at all -    BP Readings from Last 3 Encounters:  04/08/20 136/72  10/03/19 132/82  03/24/19 132/70    Physical Exam Constitutional:      Appearance: Normal appearance. She is well-developed.  HENT:     Right Ear: Ear canal and external ear normal. Tympanic membrane is not erythematous or retracted.     Left Ear: Ear canal and external ear normal. Tympanic membrane is not erythematous or retracted.     Nose:     Right Sinus: Maxillary sinus tenderness and  frontal sinus tenderness present.     Left Sinus: Maxillary sinus tenderness and frontal sinus tenderness present.     Mouth/Throat:     Mouth: No oral lesions.  Cardiovascular:     Rate and Rhythm: Normal rate and regular rhythm.     Pulses: Normal pulses.     Heart sounds: Normal heart sounds.  Pulmonary:     Effort: Pulmonary effort is normal.     Breath sounds: Normal breath sounds. No wheezing, rhonchi or rales.  Musculoskeletal:     Cervical back: Normal range of motion.  Lymphadenopathy:     Cervical: No cervical adenopathy.  Neurological:     Mental Status: She is alert and oriented to person, place, and time.     Wt Readings from Last 3 Encounters:  04/08/20 197 lb (89.4 kg)  10/03/19 209 lb (94.8 kg)  03/24/19 201 lb (91.2 kg)    BP 136/72   Pulse 66   Temp 98.3 F (36.8 C) (Oral)   Ht 5\' 5"  (1.651 m)   Wt 197 lb (89.4 kg)   SpO2 95%   BMI 32.78 kg/m   Assessment and Plan: 1. Acute non-recurrent maxillary sinusitis Continue sudafed; begin Flonase and Zpak - azithromycin (ZITHROMAX Z-PAK) 250 MG tablet; UAD  Dispense: 6 each; Refill: 0 - fluticasone (FLONASE) 50 MCG/ACT nasal spray; Place 2 sprays into both nostrils daily.  Dispense: 16 g; Refill: 1  2. Cough - guaiFENesin-codeine (ROBITUSSIN AC) 100-10 MG/5ML syrup; Take 5 mLs by mouth 3 (three) times daily as needed for cough.  Dispense: 118 mL; Refill: 0   Partially dictated using . Any errors are unintentional.  Animal nutritionist, MD [facility]  04/08/2020

## 2020-04-15 ENCOUNTER — Encounter: Payer: Self-pay | Admitting: Family Medicine

## 2020-04-30 ENCOUNTER — Other Ambulatory Visit: Payer: Self-pay | Admitting: Internal Medicine

## 2020-04-30 DIAGNOSIS — J0100 Acute maxillary sinusitis, unspecified: Secondary | ICD-10-CM

## 2020-05-07 ENCOUNTER — Encounter: Payer: Self-pay | Admitting: Family Medicine

## 2020-05-09 ENCOUNTER — Encounter: Payer: Self-pay | Admitting: Family Medicine

## 2020-05-14 ENCOUNTER — Encounter: Payer: Self-pay | Admitting: Family Medicine

## 2020-05-16 ENCOUNTER — Encounter: Payer: Self-pay | Admitting: Family Medicine

## 2020-06-18 ENCOUNTER — Other Ambulatory Visit: Payer: Self-pay | Admitting: Certified Nurse Midwife

## 2020-06-18 DIAGNOSIS — Z1231 Encounter for screening mammogram for malignant neoplasm of breast: Secondary | ICD-10-CM

## 2020-06-26 ENCOUNTER — Ambulatory Visit (INDEPENDENT_AMBULATORY_CARE_PROVIDER_SITE_OTHER): Payer: Managed Care, Other (non HMO)

## 2020-06-26 ENCOUNTER — Other Ambulatory Visit: Payer: Self-pay

## 2020-06-26 DIAGNOSIS — Z23 Encounter for immunization: Secondary | ICD-10-CM

## 2020-07-02 ENCOUNTER — Ambulatory Visit
Admission: RE | Admit: 2020-07-02 | Discharge: 2020-07-02 | Disposition: A | Discharge status: Home / Self Care | Payer: Managed Care, Other (non HMO) | Source: Ambulatory Visit | Attending: Certified Nurse Midwife | Admitting: Certified Nurse Midwife

## 2020-07-02 ENCOUNTER — Other Ambulatory Visit: Payer: Self-pay

## 2020-07-02 DIAGNOSIS — Z1231 Encounter for screening mammogram for malignant neoplasm of breast: Secondary | ICD-10-CM | POA: Insufficient documentation

## 2020-07-09 ENCOUNTER — Ambulatory Visit: Payer: Managed Care, Other (non HMO) | Admitting: Family Medicine

## 2020-07-11 ENCOUNTER — Ambulatory Visit: Payer: Managed Care, Other (non HMO) | Admitting: Family Medicine

## 2020-07-22 ENCOUNTER — Ambulatory Visit: Payer: Managed Care, Other (non HMO) | Admitting: Family Medicine

## 2020-08-06 HISTORY — PX: COLONOSCOPY: SHX174

## 2020-08-12 ENCOUNTER — Other Ambulatory Visit: Payer: Self-pay | Admitting: Orthopedic Surgery

## 2020-08-12 ENCOUNTER — Other Ambulatory Visit (HOSPITAL_COMMUNITY): Payer: Self-pay | Admitting: Orthopedic Surgery

## 2020-08-12 DIAGNOSIS — S46002A Unspecified injury of muscle(s) and tendon(s) of the rotator cuff of left shoulder, initial encounter: Secondary | ICD-10-CM

## 2020-08-12 DIAGNOSIS — M25312 Other instability, left shoulder: Secondary | ICD-10-CM

## 2020-08-12 DIAGNOSIS — M25512 Pain in left shoulder: Secondary | ICD-10-CM

## 2020-08-13 ENCOUNTER — Ambulatory Visit
Admission: RE | Admit: 2020-08-13 | Discharge: 2020-08-13 | Disposition: A | Discharge status: Home / Self Care | Payer: Managed Care, Other (non HMO) | Source: Ambulatory Visit | Attending: Orthopedic Surgery | Admitting: Orthopedic Surgery

## 2020-08-13 ENCOUNTER — Other Ambulatory Visit: Payer: Self-pay

## 2020-08-13 DIAGNOSIS — M25312 Other instability, left shoulder: Secondary | ICD-10-CM | POA: Insufficient documentation

## 2020-08-13 DIAGNOSIS — S46002A Unspecified injury of muscle(s) and tendon(s) of the rotator cuff of left shoulder, initial encounter: Secondary | ICD-10-CM | POA: Diagnosis present

## 2020-08-13 DIAGNOSIS — M25512 Pain in left shoulder: Secondary | ICD-10-CM | POA: Insufficient documentation

## 2021-05-19 ENCOUNTER — Other Ambulatory Visit: Payer: Self-pay | Admitting: Orthopedic Surgery

## 2021-05-20 ENCOUNTER — Encounter: Payer: Self-pay | Admitting: Orthopedic Surgery

## 2021-06-06 ENCOUNTER — Ambulatory Visit: Payer: Managed Care, Other (non HMO) | Admitting: Anesthesiology

## 2021-06-06 ENCOUNTER — Encounter: Payer: Self-pay | Admitting: Orthopedic Surgery

## 2021-06-06 ENCOUNTER — Ambulatory Visit
Admission: RE | Admit: 2021-06-06 | Discharge: 2021-06-06 | Disposition: A | Discharge status: Home / Self Care | Payer: Managed Care, Other (non HMO) | Source: Ambulatory Visit | Attending: Orthopedic Surgery | Admitting: Orthopedic Surgery

## 2021-06-06 ENCOUNTER — Other Ambulatory Visit: Payer: Self-pay

## 2021-06-06 ENCOUNTER — Encounter
Admission: RE | Disposition: A | Discharge status: Home / Self Care | Payer: Self-pay | Source: Ambulatory Visit | Attending: Orthopedic Surgery

## 2021-06-06 DIAGNOSIS — S43491A Other sprain of right shoulder joint, initial encounter: Secondary | ICD-10-CM | POA: Insufficient documentation

## 2021-06-06 DIAGNOSIS — K219 Gastro-esophageal reflux disease without esophagitis: Secondary | ICD-10-CM | POA: Insufficient documentation

## 2021-06-06 DIAGNOSIS — S43431A Superior glenoid labrum lesion of right shoulder, initial encounter: Secondary | ICD-10-CM | POA: Insufficient documentation

## 2021-06-06 DIAGNOSIS — X58XXXA Exposure to other specified factors, initial encounter: Secondary | ICD-10-CM | POA: Diagnosis not present

## 2021-06-06 DIAGNOSIS — R519 Headache, unspecified: Secondary | ICD-10-CM | POA: Insufficient documentation

## 2021-06-06 DIAGNOSIS — M67813 Other specified disorders of tendon, right shoulder: Secondary | ICD-10-CM | POA: Insufficient documentation

## 2021-06-06 DIAGNOSIS — M75111 Incomplete rotator cuff tear or rupture of right shoulder, not specified as traumatic: Secondary | ICD-10-CM | POA: Diagnosis not present

## 2021-06-06 DIAGNOSIS — M25811 Other specified joint disorders, right shoulder: Secondary | ICD-10-CM | POA: Diagnosis not present

## 2021-06-06 HISTORY — PX: SHOULDER ARTHROSCOPY WITH SUBACROMIAL DECOMPRESSION AND OPEN ROTATOR C: SHX5688

## 2021-06-06 HISTORY — DX: Presence of spectacles and contact lenses: Z97.3

## 2021-06-06 SURGERY — SHOULDER ARTHROSCOPY WITH SUBACROMIAL DECOMPRESSION AND OPEN ROTATOR CUFF REPAIR, OPEN BICEPS TENDON REPAIR
Anesthesia: Regional | Site: Shoulder | Laterality: Left

## 2021-06-06 MED ORDER — OXYCODONE HCL 5 MG/5ML PO SOLN: 5.0000 mg | Freq: Once | ORAL | Status: DC | PRN

## 2021-06-06 MED ORDER — ASPIRIN EC 325 MG PO TBEC: 325.0000 mg | DELAYED_RELEASE_TABLET | Freq: Every day | ORAL | 0 refills | Status: AC

## 2021-06-06 MED ORDER — FENTANYL CITRATE (PF) 100 MCG/2ML IJ SOLN
INTRAMUSCULAR | Status: DC | PRN
  Administered 2021-06-06: 100 ug via INTRAVENOUS

## 2021-06-06 MED ORDER — MIDAZOLAM HCL 2 MG/2ML IJ SOLN
INTRAMUSCULAR | Status: DC | PRN
  Administered 2021-06-06: 2 mg via INTRAVENOUS

## 2021-06-06 MED ORDER — GLYCOPYRROLATE 0.2 MG/ML IJ SOLN
INTRAMUSCULAR | Status: DC | PRN
  Administered 2021-06-06: .2 mg via INTRAVENOUS

## 2021-06-06 MED ORDER — DIPHENHYDRAMINE HCL 50 MG/ML IJ SOLN
INTRAMUSCULAR | Status: DC | PRN
  Administered 2021-06-06: 12.5 mg via INTRAVENOUS

## 2021-06-06 MED ORDER — DEXAMETHASONE SODIUM PHOSPHATE 4 MG/ML IJ SOLN
INTRAMUSCULAR | Status: DC | PRN
  Administered 2021-06-06: 8 mg via INTRAVENOUS

## 2021-06-06 MED ORDER — ACETAMINOPHEN 500 MG PO TABS: 1000.0000 mg | ORAL_TABLET | Freq: Three times a day (TID) | ORAL | 2 refills | Status: DC

## 2021-06-06 MED ORDER — ONDANSETRON 4 MG PO TBDP: 4.0000 mg | ORAL_TABLET | Freq: Three times a day (TID) | ORAL | 0 refills | Status: DC | PRN

## 2021-06-06 MED ORDER — LACTATED RINGERS IR SOLN
Status: DC | PRN
  Administered 2021-06-06 (×2): 6000 mL

## 2021-06-06 MED ORDER — LACTATED RINGERS IV SOLN: INTRAVENOUS | Status: DC

## 2021-06-06 MED ORDER — PROPOFOL 10 MG/ML IV BOLUS
INTRAVENOUS | Status: DC | PRN
  Administered 2021-06-06: 150 mg via INTRAVENOUS

## 2021-06-06 MED ORDER — BUPIVACAINE LIPOSOME 1.3 % IJ SUSP
INTRAMUSCULAR | Status: DC | PRN
Start: 2021-06-06 — End: 2021-06-06
  Administered 2021-06-06: 20 mL via PERINEURAL

## 2021-06-06 MED ORDER — SCOPOLAMINE 1 MG/3DAYS TD PT72
1.0000 | MEDICATED_PATCH | Freq: Once | TRANSDERMAL | Status: DC
  Administered 2021-06-06: 1.5 mg via TRANSDERMAL

## 2021-06-06 MED ORDER — LACTATED RINGERS IV SOLN
INTRAVENOUS | Status: DC | PRN
  Administered 2021-06-06: 12000 mL

## 2021-06-06 MED ORDER — LIDOCAINE HCL (CARDIAC) PF 100 MG/5ML IV SOSY
PREFILLED_SYRINGE | INTRAVENOUS | Status: DC | PRN
  Administered 2021-06-06: 40 mg via INTRATRACHEAL

## 2021-06-06 MED ORDER — OXYCODONE HCL 5 MG PO TABS: 5.0000 mg | ORAL_TABLET | ORAL | 0 refills | Status: DC | PRN

## 2021-06-06 MED ORDER — FENTANYL CITRATE PF 50 MCG/ML IJ SOSY: 25.0000 ug | PREFILLED_SYRINGE | INTRAMUSCULAR | Status: DC | PRN

## 2021-06-06 MED ORDER — ACETAMINOPHEN 160 MG/5ML PO SOLN: 325.0000 mg | ORAL | Status: DC | PRN

## 2021-06-06 MED ORDER — ONDANSETRON HCL 4 MG/2ML IJ SOLN
INTRAMUSCULAR | Status: DC | PRN
  Administered 2021-06-06: 4 mg via INTRAVENOUS

## 2021-06-06 MED ORDER — BUPIVACAINE HCL (PF) 0.5 % IJ SOLN
INTRAMUSCULAR | Status: DC | PRN
  Administered 2021-06-06: 20 mL via PERINEURAL

## 2021-06-06 MED ORDER — ACETAMINOPHEN 325 MG PO TABS: 325.0000 mg | ORAL_TABLET | ORAL | Status: DC | PRN

## 2021-06-06 MED ORDER — OXYCODONE HCL 5 MG PO TABS: 5.0000 mg | ORAL_TABLET | Freq: Once | ORAL | Status: DC | PRN

## 2021-06-06 MED ORDER — CEFAZOLIN SODIUM-DEXTROSE 2-4 GM/100ML-% IV SOLN
2.0000 g | INTRAVENOUS | Status: AC
  Administered 2021-06-06: 2 g via INTRAVENOUS

## 2021-06-06 MED ORDER — PHENYLEPHRINE HCL (PRESSORS) 10 MG/ML IV SOLN
INTRAVENOUS | Status: DC | PRN
  Administered 2021-06-06 (×3): 100 ug via INTRAVENOUS

## 2021-06-06 SURGICAL SUPPLY — 52 items
ADAPTER IRRIG TUBE 2 SPIKE SOL (ADAPTER) ×4 IMPLANT
ANCHOR BONE REGENETEN (Anchor) ×1 IMPLANT
ANCHOR TENDON REGENETEN (Staple) ×2 IMPLANT
BLADE SHAVER 4.5X7 STR FR (MISCELLANEOUS) ×2 IMPLANT
BUR BR 5.5 WIDE MOUTH (BURR) ×2 IMPLANT
CANNULA PART THRD DISP 5.75X7 (CANNULA) IMPLANT
CANNULA PARTIAL THREAD 2X7 (CANNULA) IMPLANT
CHLORAPREP W/TINT 26 (MISCELLANEOUS) ×2 IMPLANT
COOLER POLAR GLACIER W/PUMP (MISCELLANEOUS) ×2 IMPLANT
COVER LIGHT HANDLE UNIVERSAL (MISCELLANEOUS) ×4 IMPLANT
DERMABOND ADVANCED (GAUZE/BANDAGES/DRESSINGS)
DERMABOND ADVANCED .7 DNX12 (GAUZE/BANDAGES/DRESSINGS) IMPLANT
DRAPE INCISE IOBAN 66X45 STRL (DRAPES) ×2 IMPLANT
DRAPE U-SHAPE 48X52 POLY STRL (PACKS) ×2 IMPLANT
DRSG TEGADERM 4X4.75 (GAUZE/BANDAGES/DRESSINGS) ×6 IMPLANT
DURAPREP 26ML APPLICATOR (WOUND CARE) ×1 IMPLANT
ELECT REM PT RETURN 9FT ADLT (ELECTROSURGICAL) ×2
ELECTRODE REM PT RTRN 9FT ADLT (ELECTROSURGICAL) IMPLANT
GAUZE SPONGE 4X4 12PLY STRL (GAUZE/BANDAGES/DRESSINGS) ×2 IMPLANT
GAUZE XEROFORM 1X8 LF (GAUZE/BANDAGES/DRESSINGS) ×2 IMPLANT
GLOVE SRG 8 PF TXTR STRL LF DI (GLOVE) ×3 IMPLANT
GLOVE SURG ENC MOIS LTX SZ7.5 (GLOVE) ×8 IMPLANT
GLOVE SURG UNDER POLY LF SZ8 (GLOVE) ×3
GOWN STRL REIN 2XL XLG LVL4 (GOWN DISPOSABLE) ×2 IMPLANT
GOWN STRL REUS W/ TWL LRG LVL3 (GOWN DISPOSABLE) ×3 IMPLANT
GOWN STRL REUS W/TWL LRG LVL3 (GOWN DISPOSABLE) ×3
IV LACTATED RINGER IRRG 3000ML (IV SOLUTION) ×6
IV LR IRRIG 3000ML ARTHROMATIC (IV SOLUTION) ×6 IMPLANT
KIT STABILIZATION SHOULDER (MISCELLANEOUS) ×2 IMPLANT
KIT TURNOVER KIT A (KITS) ×2 IMPLANT
MANIFOLD NEPTUNE II (INSTRUMENTS) ×2 IMPLANT
MASK FACE SPIDER DISP (MASK) ×2 IMPLANT
MAT GRAY ABSORB FLUID 28X50 (MISCELLANEOUS) ×4 IMPLANT
PACK ARTHROSCOPY SHOULDER (MISCELLANEOUS) ×2 IMPLANT
PAD ABD DERMACEA PRESS 5X9 (GAUZE/BANDAGES/DRESSINGS) ×4 IMPLANT
PAD WRAPON POLAR SHDR XLG (MISCELLANEOUS) ×1 IMPLANT
SPONGE T-LAP 18X18 ~~LOC~~+RFID (SPONGE) ×2 IMPLANT
SUT ETHILON 3-0 FS-10 30 BLK (SUTURE) ×2
SUT MNCRL 4-0 (SUTURE)
SUT MNCRL 4-0 27XMFL (SUTURE)
SUT PDSII 0 (SUTURE) ×1 IMPLANT
SUT VIC AB 0 CT1 36 (SUTURE) ×1 IMPLANT
SUT VIC AB 2-0 CT2 27 (SUTURE) IMPLANT
SUTURE EHLN 3-0 FS-10 30 BLK (SUTURE) ×1 IMPLANT
SUTURE MNCRL 4-0 27XMF (SUTURE) IMPLANT
SYSTEM IMPL TENODESIS LNT 2.9 (Orthopedic Implant) ×1 IMPLANT
TAPE MICROFOAM 4IN (TAPE) ×2 IMPLANT
TUBING CONNECTING 10 (TUBING) ×2 IMPLANT
TUBING INFLOW SET DBFLO PUMP (TUBING) ×2 IMPLANT
TUBING OUTFLOW SET DBLFO PUMP (TUBING) ×2 IMPLANT
WAND WEREWOLF FLOW 90D (MISCELLANEOUS) ×2 IMPLANT
WRAPON POLAR PAD SHDR XLG (MISCELLANEOUS) ×2

## 2021-06-06 NOTE — Discharge Instructions (Signed)
Post-Op Instructions - Regeneten Patch  1. Bracing: You will wear a shoulder immobilizer or sling for 1-2 weeks.   2. Driving: No driving for at least 2 weeks post-op.   3. Activity: Progress to motion as tolerated, moving from passive to active-assisted to active motion. For the first 4 weeks, forward flexion is limited to 100. External rotation with the arm by the side is allowed, but abduction-external rotation is not allowed for the first 6 weeks. After 6 weeks, no restrictions on motion or arm use. Return to normal activities normally takes 4-6 months on average. If rehab goes very well, may be able to do most activities at 4 months, except overhead or contact sports.  4. Physical Therapy: Begins 3-4 days after surgery  5. Medications:  - You will be provided a prescription for narcotic pain medicine. After surgery, take 1-2 narcotic tablets every 4 hours if needed for severe pain.  - A prescription for anti-nausea medication will be provided in case the narcotic medicine causes nausea - take 1 tablet every 6 hours only if nauseated.   - Take tylenol 1000 mg (2 Extra Strength tablets or 3 regular strength) every 8 hours for pain.  May decrease or stop tylenol 5 days after surgery if you are having minimal pain. - Take ASA 325mg /day x 2 weeks to help prevent DVTs/PEs (blood clots).  - DO NOT take ANY nonsteroidal anti-inflammatory pain medications (Advil, Motrin, Ibuprofen, Aleve, Naproxen, or Naprosyn). These medicines can inhibit healing of your shoulder repair.    If you are taking prescription medication for anxiety, depression, insomnia, muscle spasm, chronic pain, or for attention deficit disorder, you are advised that you are at a higher risk of adverse effects with use of narcotics post-op, including narcotic addiction/dependence, depressed breathing, death. If you use non-prescribed substances: alcohol, marijuana, cocaine, heroin, methamphetamines, etc., you are at a higher risk of  adverse effects with use of narcotics post-op, including narcotic addiction/dependence, depressed breathing, death. You are advised that taking > 50 morphine milligram equivalents (MME) of narcotic pain medication per day results in twice the risk of overdose or death. For your prescription provided: oxycodone 5 mg - taking more than 6 tablets per day would result in > 50 morphine milligram equivalents (MME) of narcotic pain medication. Be advised that we will prescribe narcotics short-term, for acute post-operative pain only - 3 weeks for major operations such as shoulder repair/reconstruction surgeries.     6. Post-Op Appointment:  Your first post-op appointment will be 10-14 days post-op.  7. Work or School: For most, but not all procedures, we advise staying out of work or school for at least 1 to 2 weeks in order to recover from the stress of surgery and to allow time for healing.   If you need a work or school note this can be provided.   8. Smoking: If you are a smoker, you need to refrain from smoking in the postoperative period. The nicotine in cigarettes will inhibit healing of your shoulder repair and decrease the chance of successful repair. Similarly, nicotine containing products (gum, patches) should be avoided.   Post-operative Brace: Apply and remove the brace you received as you were instructed to at the time of fitting and as described in detail as the braces instructions for use indicate.  Wear the brace for the period of time prescribed by your physician.  The brace can be cleaned with soap and water and allowed to air dry only.  Should the brace  result in increased pain, decreased feeling (numbness/tingling), increased swelling or an overall worsening of your medical condition, please contact your doctor immediately.  If an emergency situation occurs as a result of wearing the brace after normal business hours, please dial 911 and seek immediate medical attention.  Let your  doctor know if you have any further questions about the brace issued to you. Refer to the shoulder sling instructions for use if you have any questions regarding the correct fit of your shoulder sling.  [facility] Customer Care for Troubleshooting: [phone]  Video that illustrates how to properly use a shoulder sling: "Instructions for Proper Use of an Orthopaedic Sling" [email or web address].com/

## 2021-06-06 NOTE — Op Note (Signed)
SURGERY DATE: 06/06/2021   PRE-OP DIAGNOSIS:  1. Right partial thickness rotator cuff tear 2. Right biceps tendinopathy 3. Right subacromial impingement  POST-OP DIAGNOSIS: 1. Right partial thickness rotator cuff tear 2. Right biceps tendinopathy 3. Right subacromial impingement  PROCEDURES:  1. Right arthroscopic rotator cuff repair using Regeneten patch 2. Right arthroscopic biceps tenodesis 3. Right extensive debridement of shoulder (glenohumeral and subacromial spaces) 4. Right arthroscopic subacromial decompression  SURGEON: Rosealee Albee, MD  ASSISTANT: Sonny Dandy, PA  ANESTHESIA: Gen with interscalene block w/Exparil  ESTIMATED BLOOD LOSS: 5cc  DRAINS:  none  TOTAL IV FLUIDS: per anesthesia   SPECIMENS: none  IMPLANTS:  - [employer] Regeneten patch with associated tendon and bone staples - Arthrex 2.34mm PushLock anchor (arthroscopic biceps tenodesis)  OPERATIVE FINDINGS:  Examination under anesthesia: A careful examination under anesthesia was performed.  Passive range of motion was: FF: 160; ER at side: 80; ER in abduction: 105; IR in abduction: 45.  Anterior load shift: NT.  Posterior load shift: NT.  Sulcus in neutral: NT.  Sulcus in ER: NT.    Intra-operative findings: A thorough arthroscopic examination of the shoulder was performed.  The findings are: 1. Biceps tendon: Significant tendinopathy with erythema 2. Superior labrum: Type 2 SLAP tear 3. Posterior labrum and capsule: normal 4. Inferior capsule and inferior recess: normal 5. Glenoid cartilage surface: Normal 6. Supraspinatus attachment: Interstitial and bursal sided tearing of the supraspinatus 7. Posterior rotator cuff attachment: normal  8. Humeral head articular cartilage: normal 9. Rotator interval: Synovitic 10: Subscapularis tendon: Normal 11. Anterior labrum: degenerative tearing 12. IGHL: normal  OPERATIVE REPORT:   Indications for procedure: Tamara [name] is a 56 y.o. female  with a history of adhesive capsulitis that resolved with corticosteroid injection and exercises.  Patient had recurrent pain for the past 3 months after previously regaining full function.  She has failed extensive nonoperative management.  Clinical exam and MRI were significant for partial thickness rotator cuff tear and biceps tendinopathy. After discussion of risks, benefits, and alternatives to surgery, the patient elected to proceed with above mentioned procedures. The patient understands that use of the Regeneten patch is relatively new and long-term data is unknown.  Procedure in detail:  I identified Tamara [name] in the pre-operative holding area.  I marked the operative shoulder with my initials. I reviewed the risks and benefits of the proposed surgical intervention, and the patient wished to proceed.  Anesthesia was then performed with an interscalene block with Exparel.  The patient was transferred to the operative suite and placed in the beach chair position.    SCDs were placed on the lower extremities. Appropriate IV antibiotics were administered. The operative upper extremity was then prepped and draped in standard fashion. A time out was performed confirming the correct extremity, correct patient and correct procedure.   I then created a standard posterior portal with an 11 blade. The glenohumeral joint was easily entered with a blunt trochar and the arthroscope introduced. The findings of diagnostic arthroscopy are described above. I debrided the degenerative anterior labrum, superior labrum, and also debrided and coagulated the inflamed synovium to obtain hemostasis and reduce the risk of post-operative swelling using an Arthrocare radiofrequency device.    I then turned my attention to the arthroscopic biceps tenodesis.  I used the loop n tack technique to pass a fiber tape through the biceps in a locked fashion adjacent to the biceps anchor.  The biceps tendon was cut  at its  attachment on the superior labrum.  The remnant stump was debrided down to a stable base on the biceps anchor complex using an oscillating shaver.  A hole for a 2.9 mm Arthrex PushLock was drilled in the bicipital groove just superior to the subscapularis tendon insertion.  The fiber tape was loaded onto the PushLock anchor and impacted into place into the previously drilled hole in the bicipital groove.  This appropriately secured the biceps into the bicipital groove and took it off of tension.  A spinal needle was placed in the anterior aspect of the supraspinatus and an 0-PDS suture was passed through the needle and retrieved out of the anterior portal to mark this region.  Next, the arthroscope was then introduced into the subacromial space. A direct lateral portal was created with an 11-blade after spinal needle localization. An extensive subacromial bursectomy and debridement was performed using a combination of the shaver and Arthrocare wand. The entire acromial undersurface was exposed and the CA ligament was subperiosteally elevated to expose the prominent anterior acromial hook. A burr was used to create a flat anterior and lateral aspect of the acromion, converting it from a Type 2 to a Type 1 acromion. Care was made to keep the deltoid fascia intact.  Next, a switching stick and probe were used to probe the bursal side of the rotator cuff posterior to the previously passed 0 PDS suture.  The bursal side of the rotator cuff was intact as the switching stick could not be pushed through into the glenohumeral joint.  This confirmed appropriate partial-thickness nature of the tear. Given this, we decided to proceed with repair of the partial-thickness rotator cuff tear using a Regeneten patch. The Regeneten patch delivery gun was placed appropriately and the patch was delivered over the supraspinatus tendon using the PDS suture as a reference. It was positioned such the medial border of the patch was  medial and posterior to the PDS suture. Tendon staples were placed medially, anteriorly, and posteriorly after making appropriate stab incisions for portal placement. Two bone staples were then placed laterally. The patch was then probed to confirm appropriate stability.   Arthroscopic fluid was evacuated from the joint.  The portals were closed with 3-0 Nylon. Xeroform was applied to the portals. A sterile dressing was applied, followed by a Polar Care sleeve and a SlingShot shoulder immobilizer/sling. The patient awoke from anesthesia without difficulty and was transferred to the PACU in stable condition.   Of note, assistance from a PA was essential to performing the surgery.  PA was present for the entire surgery.  PA assisted with patient positioning, retraction, instrumentation, and wound closure. The surgery would have been more difficult and had longer operative time without PA assistance.    COMPLICATIONS: none  DISPOSITION: plan for discharge home after recovery in PACU  POSTOPERATIVE PLAN: Remain in sling (except hygiene and elbow/wrist/hand RoM exercises as instructed by PT) x 2 weeks and NWB for this time. Wean from sling as tolerated after 2 weeks.  PT to begin 3-4 days after surgery. Use Regeneten Patch rehab protocol: For the first 4 weeks, forward flexion is limited to 100. External rotation with the arm by the side is allowed, but abduction-external rotation is not allowed for the first 6 weeks. After 6 weeks, no restrictions on motion or arm use.  ASA for DVT prophylaxis.  Follow-up as an outpatient as scheduled in approximately 2 weeks.

## 2021-06-06 NOTE — Transfer of Care (Signed)
Immediate Anesthesia Transfer of Care Note  Patient: Tamara [name]  Procedure(s) Performed: Left shoulder arthroscopic subacromial decompression, biceps tenodesis,  Regeneten patch application (Left: Shoulder)  Patient Location: PACU  Anesthesia Type: General, Regional  Level of Consciousness: awake, alert  and patient cooperative  Airway and Oxygen Therapy: Patient Spontanous Breathing and Patient connected to supplemental oxygen  Post-op Assessment: Post-op Vital signs reviewed, Patient's Cardiovascular Status Stable, Respiratory Function Stable, Patent Airway and No signs of Nausea or vomiting  Post-op Vital Signs: Reviewed and stable  Complications: No notable events documented.

## 2021-06-06 NOTE — Anesthesia Procedure Notes (Signed)
Procedure Name: LMA Insertion Date/Time: 06/06/2021 10:34 AM Performed by: Michaele Offer, CRNA Pre-anesthesia Checklist: Patient identified, Emergency Drugs available, Suction available, Patient being monitored and Timeout performed Patient Re-evaluated:Patient Re-evaluated prior to induction Oxygen Delivery Method: Circle system utilized Preoxygenation: Pre-oxygenation with 100% oxygen Induction Type: IV induction Ventilation: Mask ventilation without difficulty LMA: LMA inserted LMA Size: 4.0 Number of attempts: 1 Placement Confirmation: positive ETCO2 and breath sounds checked- equal and bilateral Tube secured with: Tape Dental Injury: Teeth and Oropharynx as per pre-operative assessment

## 2021-06-06 NOTE — Anesthesia Preprocedure Evaluation (Signed)
Anesthesia Evaluation  Patient identified by MRN, date of birth, ID band Patient awake    Reviewed: Allergy & Precautions, NPO status   History of Anesthesia Complications (+) PONV  Airway Mallampati: II  TM Distance: >3 FB     Dental   Pulmonary    Pulmonary exam normal        Cardiovascular negative cardio ROS   Rhythm:Regular Rate:Normal     Neuro/Psych  Headaches,    GI/Hepatic GERD  ,  Endo/Other    Renal/GU      Musculoskeletal  (+) Arthritis ,   Abdominal   Peds  Hematology   Anesthesia Other Findings   Reproductive/Obstetrics                             Anesthesia Physical Anesthesia Plan  ASA: 2  Anesthesia Plan: General and Regional   Post-op Pain Management: Regional block   Induction: Intravenous  PONV Risk Score and Plan: 3 and Treatment may vary due to age or medical condition, Ondansetron, Dexamethasone, Midazolam and Scopolamine patch - Pre-op  Airway Management Planned: LMA  Additional Equipment:   Intra-op Plan:   Post-operative Plan:   Informed Consent: I have reviewed the patients History and Physical, chart, labs and discussed the procedure including the risks, benefits and alternatives for the proposed anesthesia with the patient or authorized representative who has indicated his/her understanding and acceptance.     Dental advisory given  Plan Discussed with: CRNA  Anesthesia Plan Comments:         Anesthesia Quick Evaluation

## 2021-06-06 NOTE — Anesthesia Postprocedure Evaluation (Signed)
Anesthesia Post Note  Patient: Tamara [name]  Procedure(s) Performed: Left shoulder arthroscopic subacromial decompression, biceps tenodesis,  Regeneten patch application (Left: Shoulder)     Patient location during evaluation: PACU Anesthesia Type: Regional and General Level of consciousness: awake Pain management: pain level controlled Vital Signs Assessment: post-procedure vital signs reviewed and stable Respiratory status: respiratory function stable Cardiovascular status: stable Postop Assessment: no signs of nausea or vomiting Anesthetic complications: no   No notable events documented.  Veda Canning

## 2021-06-06 NOTE — Anesthesia Procedure Notes (Signed)
Anesthesia Regional Block: Interscalene brachial plexus block   Pre-Anesthetic Checklist: , timeout performed,  Correct Patient, Correct Site, Correct Laterality,  Correct Procedure, Correct Position, site marked,  Risks and benefits discussed,  Surgical consent,  Pre-op evaluation,  At surgeon's request and post-op pain management  Laterality: Left  Prep: chloraprep       Needles:  Injection technique: Single-shot  Needle Type: Stimiplex     Needle Length: 10cm  Needle Gauge: 21     Additional Needles:   Procedures:,,,, ultrasound used (permanent image in chart),,    Narrative:  Start time: 06/06/2021 9:37 AM End time: 06/06/2021 9:41 AM Injection made incrementally with aspirations every 5 mL.  Performed by: Personally  Anesthesiologist: Jola Babinski, MD  Additional Notes: Functioning IV was confirmed and monitors applied. Ultrasound guidance: relevant anatomy identified, needle position confirmed, local anesthetic spread visualized around nerve(s)., vascular puncture avoided.  Image printed for medical record.  Negative aspiration and no paresthesias; incremental administration of local anesthetic. The patient tolerated the procedure well. Vitals signs recorded in RN notes.

## 2021-06-06 NOTE — H&P (Signed)
Paper H&P to be scanned into permanent record. H&P reviewed. No significant changes noted.  

## 2021-06-09 ENCOUNTER — Encounter: Payer: Self-pay | Admitting: Orthopedic Surgery

## 2021-09-03 ENCOUNTER — Other Ambulatory Visit: Payer: Self-pay | Admitting: Orthopedic Surgery

## 2021-09-05 ENCOUNTER — Encounter
Admission: RE | Admit: 2021-09-05 | Discharge: 2021-09-05 | Disposition: A | Discharge status: Home / Self Care | Payer: Managed Care, Other (non HMO) | Source: Ambulatory Visit | Attending: Orthopedic Surgery | Admitting: Orthopedic Surgery

## 2021-09-05 HISTORY — DX: Unilateral primary osteoarthritis, right knee: M17.11

## 2021-09-05 HISTORY — DX: Fatty (change of) liver, not elsewhere classified: K76.0

## 2021-09-05 NOTE — Patient Instructions (Addendum)
Your procedure is scheduled on: Monday, May 1 ?Report to the Registration Desk on the 1st floor of the [facility]. ?To find out your arrival time, please call [phone] between 1PM - 3PM on: Friday, April 28 ? ?REMEMBER: ?Instructions that are not followed completely may result in serious medical risk, up to and including death; or upon the discretion of your surgeon and anesthesiologist your surgery may need to be rescheduled. ? ?Do not eat food after midnight the night before surgery.  ?No gum chewing, lozengers or hard candies. ? ?You may however, drink CLEAR liquids up to 2 hours before you are scheduled to arrive for your surgery. Do not drink anything within 2 hours of your scheduled arrival time. ? ?Clear liquids include: ?- water  ?- apple juice without pulp ?- gatorade (not RED colors) ?- black coffee or tea (Do NOT add milk or creamers to the coffee or tea) ?Do NOT drink anything that is not on this list. ? ?In addition, your doctor has ordered for you to drink the provided  ?Ensure Pre-Surgery Clear Carbohydrate Drink  ?Drinking this carbohydrate drink up to two hours before surgery helps to reduce insulin resistance and improve patient outcomes. Please complete drinking 2 hours prior to scheduled arrival time. ? ?TAKE THESE MEDICATIONS THE MORNING OF SURGERY WITH A SIP OF WATER: ? ?Pantoprazole (Protonix) - (take one the night before and one on the morning of surgery - helps to prevent nausea after surgery.) ?Atorvastatin ? ?One week prior to surgery: ?Stop Anti-inflammatories (NSAIDS) such as Advil, Aleve, Ibuprofen, Motrin, Naproxen, Naprosyn and Aspirin based products such as Excedrin, Goodys Powder, BC Powder. ?Stop ANY OVER THE COUNTER supplements until after surgery. ?You may however, continue to take Tylenol if needed for pain up until the day of surgery. ? ?No Alcohol for 24 hours before or after surgery. ? ?No Smoking including e-cigarettes for 24 hours prior to surgery.  ?No chewable  tobacco products for at least 6 hours prior to surgery.  ?No nicotine patches on the day of surgery. ? ?Do not use any "recreational" drugs for at least a week prior to your surgery.  ?Please be advised that the combination of cocaine and anesthesia may have negative outcomes, up to and including death. ?If you test positive for cocaine, your surgery will be cancelled. ? ?On the morning of surgery brush your teeth with toothpaste and water, you may rinse your mouth with mouthwash if you wish. ?Do not swallow any toothpaste or mouthwash. ? ?Use CHG Soap as directed on instruction sheet. Please test this soap on your arm/hand for allergic reaction prior to using on your body. ? ?Do not wear jewelry, make-up, hairpins, clips or nail polish. ? ?Do not wear lotions, powders, or perfumes.  ? ?Do not shave body from the neck down 48 hours prior to surgery just in case you cut yourself which could leave a site for infection.  ?Also, freshly shaved skin may become irritated if using the CHG soap. ? ?Contact lenses, hearing aids and dentures may not be worn into surgery. ? ?Do not bring valuables to the hospital. [facility] is not responsible for any missing/lost belongings or valuables.  ? ?Notify your doctor if there is any change in your medical condition (cold, fever, infection). ? ?Wear comfortable clothing (specific to your surgery type) to the hospital. ? ?After surgery, you can help prevent lung complications by doing breathing exercises.  ?Take deep breaths and cough every 1-2 hours. Your doctor may  order a device called an Incentive Spirometer to help you take deep breaths. ? ?If you are being discharged the day of surgery, you will not be allowed to drive home. ?You will need a responsible adult (18 years or older) to drive you home and stay with you that night.  ? ?If you are taking public transportation, you will need to have a responsible adult (18 years or older) with you. ?Please confirm with your physician  that it is acceptable to use public transportation.  ? ?Please call the Pre-admissions Testing Dept. at [phone] if you have any questions about these instructions. ? ?Surgery Visitation Policy: ? ?Patients undergoing a surgery or procedure may have two family members or support persons with them as long as the person is not COVID-19 positive or experiencing its symptoms.  ?

## 2021-09-07 MED ORDER — CEFAZOLIN SODIUM-DEXTROSE 2-4 GM/100ML-% IV SOLN
2.0000 g | INTRAVENOUS | Status: AC
  Administered 2021-09-08: 2 g via INTRAVENOUS

## 2021-09-07 MED ORDER — APREPITANT 40 MG PO CAPS: 40.0000 mg | ORAL_CAPSULE | Freq: Once | ORAL | Status: AC

## 2021-09-07 MED ORDER — LACTATED RINGERS IV SOLN: INTRAVENOUS | Status: DC

## 2021-09-07 MED ORDER — ORAL CARE MOUTH RINSE: 15.0000 mL | Freq: Once | OROMUCOSAL | Status: AC

## 2021-09-07 MED ORDER — CHLORHEXIDINE GLUCONATE 0.12 % MT SOLN: 15.0000 mL | Freq: Once | OROMUCOSAL | Status: AC

## 2021-09-08 ENCOUNTER — Encounter
Admission: RE | Disposition: A | Discharge status: Home / Self Care | Payer: Self-pay | Source: Home / Self Care | Attending: Orthopedic Surgery

## 2021-09-08 ENCOUNTER — Ambulatory Visit: Payer: Managed Care, Other (non HMO) | Admitting: Anesthesiology

## 2021-09-08 ENCOUNTER — Other Ambulatory Visit: Payer: Self-pay

## 2021-09-08 ENCOUNTER — Ambulatory Visit
Admission: RE | Admit: 2021-09-08 | Discharge: 2021-09-08 | Disposition: A | Discharge status: Home / Self Care | Payer: Managed Care, Other (non HMO) | Attending: Orthopedic Surgery | Admitting: Orthopedic Surgery

## 2021-09-08 ENCOUNTER — Encounter: Payer: Self-pay | Admitting: Orthopedic Surgery

## 2021-09-08 ENCOUNTER — Ambulatory Visit: Payer: Managed Care, Other (non HMO)

## 2021-09-08 DIAGNOSIS — M7502 Adhesive capsulitis of left shoulder: Secondary | ICD-10-CM | POA: Diagnosis present

## 2021-09-08 DIAGNOSIS — R519 Headache, unspecified: Secondary | ICD-10-CM | POA: Diagnosis not present

## 2021-09-08 DIAGNOSIS — M659 Synovitis and tenosynovitis, unspecified: Secondary | ICD-10-CM | POA: Diagnosis not present

## 2021-09-08 DIAGNOSIS — M7552 Bursitis of left shoulder: Secondary | ICD-10-CM | POA: Insufficient documentation

## 2021-09-08 DIAGNOSIS — K760 Fatty (change of) liver, not elsewhere classified: Secondary | ICD-10-CM | POA: Insufficient documentation

## 2021-09-08 DIAGNOSIS — K219 Gastro-esophageal reflux disease without esophagitis: Secondary | ICD-10-CM | POA: Diagnosis not present

## 2021-09-08 HISTORY — PX: CLOSED MANIPULATION SHOULDER WITH STERIOD INJECTION: SHX5611

## 2021-09-08 HISTORY — PX: SHOULDER ARTHROSCOPY: SHX128

## 2021-09-08 SURGERY — ARTHROSCOPY, SHOULDER
Anesthesia: Regional | Site: Shoulder | Laterality: Left

## 2021-09-08 MED ORDER — RINGERS IRRIGATION IR SOLN
Status: DC | PRN
  Administered 2021-09-08: 3000 mL

## 2021-09-08 MED ORDER — FENTANYL CITRATE PF 50 MCG/ML IJ SOSY: 50.0000 ug | PREFILLED_SYRINGE | Freq: Once | INTRAMUSCULAR | Status: AC

## 2021-09-08 MED ORDER — ONDANSETRON HCL 4 MG/2ML IJ SOLN
INTRAMUSCULAR | Status: DC | PRN
  Administered 2021-09-08: 4 mg via INTRAVENOUS

## 2021-09-08 MED ORDER — BUPIVACAINE HCL (PF) 0.5 % IJ SOLN
INTRAMUSCULAR | Status: AC
  Filled 2021-09-08: qty 30

## 2021-09-08 MED ORDER — APREPITANT 40 MG PO CAPS
ORAL_CAPSULE | ORAL | Status: AC
  Administered 2021-09-08: 40 mg via ORAL
  Filled 2021-09-08: qty 1

## 2021-09-08 MED ORDER — ACETAMINOPHEN 500 MG PO TABS: 1000.0000 mg | ORAL_TABLET | Freq: Three times a day (TID) | ORAL | 2 refills | Status: AC

## 2021-09-08 MED ORDER — ASPIRIN EC 325 MG PO TBEC: 325.0000 mg | DELAYED_RELEASE_TABLET | Freq: Every day | ORAL | 0 refills | Status: AC

## 2021-09-08 MED ORDER — SCOPOLAMINE 1 MG/3DAYS TD PT72: 1.0000 | MEDICATED_PATCH | TRANSDERMAL | Status: DC

## 2021-09-08 MED ORDER — PROPOFOL 500 MG/50ML IV EMUL
INTRAVENOUS | Status: AC
  Filled 2021-09-08: qty 50

## 2021-09-08 MED ORDER — CEFAZOLIN SODIUM-DEXTROSE 2-4 GM/100ML-% IV SOLN
INTRAVENOUS | Status: AC
  Filled 2021-09-08: qty 100

## 2021-09-08 MED ORDER — TRIAMCINOLONE ACETONIDE 40 MG/ML IJ SUSP
INTRAMUSCULAR | Status: AC
  Filled 2021-09-08: qty 2

## 2021-09-08 MED ORDER — MIDAZOLAM HCL 2 MG/2ML IJ SOLN: 1.0000 mg | INTRAMUSCULAR | Status: DC | PRN

## 2021-09-08 MED ORDER — TRIAMCINOLONE ACETONIDE 40 MG/ML IJ SUSP
INTRAMUSCULAR | Status: DC | PRN
  Administered 2021-09-08: 10 mL

## 2021-09-08 MED ORDER — DEXAMETHASONE SODIUM PHOSPHATE 10 MG/ML IJ SOLN
INTRAMUSCULAR | Status: DC | PRN
  Administered 2021-09-08: 10 mg via INTRAVENOUS

## 2021-09-08 MED ORDER — IBUPROFEN 800 MG PO TABS: 800.0000 mg | ORAL_TABLET | Freq: Three times a day (TID) | ORAL | 1 refills | Status: AC

## 2021-09-08 MED ORDER — ROCURONIUM BROMIDE 10 MG/ML (PF) SYRINGE
PREFILLED_SYRINGE | INTRAVENOUS | Status: AC
  Filled 2021-09-08: qty 10

## 2021-09-08 MED ORDER — MIDAZOLAM HCL 2 MG/2ML IJ SOLN
INTRAMUSCULAR | Status: AC
  Administered 2021-09-08: 1 mg via INTRAVENOUS
  Filled 2021-09-08: qty 2

## 2021-09-08 MED ORDER — EPINEPHRINE PF 1 MG/ML IJ SOLN
INTRAMUSCULAR | Status: AC
  Filled 2021-09-08: qty 4

## 2021-09-08 MED ORDER — ACETAMINOPHEN 10 MG/ML IV SOLN: 1000.0000 mg | Freq: Once | INTRAVENOUS | Status: DC | PRN

## 2021-09-08 MED ORDER — 0.9 % SODIUM CHLORIDE (POUR BTL) OPTIME
TOPICAL | Status: DC | PRN
  Administered 2021-09-08: 500 mL

## 2021-09-08 MED ORDER — LIDOCAINE HCL (PF) 1 % IJ SOLN
INTRAMUSCULAR | Status: AC
  Filled 2021-09-08: qty 30

## 2021-09-08 MED ORDER — LIDOCAINE HCL (PF) 2 % IJ SOLN
INTRAMUSCULAR | Status: AC
  Filled 2021-09-08: qty 5

## 2021-09-08 MED ORDER — CHLORHEXIDINE GLUCONATE 0.12 % MT SOLN
OROMUCOSAL | Status: AC
  Administered 2021-09-08: 15 mL via OROMUCOSAL
  Filled 2021-09-08: qty 15

## 2021-09-08 MED ORDER — BUPIVACAINE HCL (PF) 0.5 % IJ SOLN
INTRAMUSCULAR | Status: DC | PRN
  Administered 2021-09-08: 10 mL

## 2021-09-08 MED ORDER — OXYCODONE HCL 5 MG PO TABS: 5.0000 mg | ORAL_TABLET | ORAL | 0 refills | Status: AC | PRN

## 2021-09-08 MED ORDER — LIDOCAINE HCL (CARDIAC) PF 100 MG/5ML IV SOSY
PREFILLED_SYRINGE | INTRAVENOUS | Status: DC | PRN
Start: 2021-09-08 — End: 2021-09-08
  Administered 2021-09-08: 100 mg via INTRAVENOUS

## 2021-09-08 MED ORDER — GLYCOPYRROLATE 0.2 MG/ML IJ SOLN
INTRAMUSCULAR | Status: AC
  Filled 2021-09-08: qty 1

## 2021-09-08 MED ORDER — ONDANSETRON HCL 4 MG/2ML IJ SOLN: 4.0000 mg | Freq: Once | INTRAMUSCULAR | Status: DC | PRN

## 2021-09-08 MED ORDER — FENTANYL CITRATE (PF) 100 MCG/2ML IJ SOLN
INTRAMUSCULAR | Status: AC
  Filled 2021-09-08: qty 2

## 2021-09-08 MED ORDER — SCOPOLAMINE 1 MG/3DAYS TD PT72
MEDICATED_PATCH | TRANSDERMAL | Status: AC
  Administered 2021-09-08: 1.5 mg via TRANSDERMAL
  Filled 2021-09-08: qty 1

## 2021-09-08 MED ORDER — LACTATED RINGERS IV SOLN
INTRAVENOUS | Status: DC | PRN
  Administered 2021-09-08 (×2): 6002 mL

## 2021-09-08 MED ORDER — BUPIVACAINE HCL (PF) 0.5 % IJ SOLN
INTRAMUSCULAR | Status: AC
  Filled 2021-09-08: qty 10

## 2021-09-08 MED ORDER — OXYCODONE HCL 5 MG/5ML PO SOLN: 5.0000 mg | Freq: Once | ORAL | Status: DC | PRN

## 2021-09-08 MED ORDER — PROPOFOL 10 MG/ML IV BOLUS
INTRAVENOUS | Status: AC
  Filled 2021-09-08: qty 20

## 2021-09-08 MED ORDER — PROPOFOL 10 MG/ML IV BOLUS
INTRAVENOUS | Status: DC | PRN
  Administered 2021-09-08: 140 ug/kg/min via INTRAVENOUS
  Administered 2021-09-08: 120 mg via INTRAVENOUS

## 2021-09-08 MED ORDER — FENTANYL CITRATE (PF) 100 MCG/2ML IJ SOLN
INTRAMUSCULAR | Status: DC | PRN
  Administered 2021-09-08 (×2): 25 ug via INTRAVENOUS
  Administered 2021-09-08: 50 ug via INTRAVENOUS

## 2021-09-08 MED ORDER — DEXAMETHASONE SODIUM PHOSPHATE 10 MG/ML IJ SOLN
INTRAMUSCULAR | Status: AC
  Filled 2021-09-08: qty 1

## 2021-09-08 MED ORDER — FENTANYL CITRATE PF 50 MCG/ML IJ SOSY
PREFILLED_SYRINGE | INTRAMUSCULAR | Status: AC
  Administered 2021-09-08: 50 ug via INTRAVENOUS
  Filled 2021-09-08: qty 1

## 2021-09-08 MED ORDER — BUPIVACAINE LIPOSOME 1.3 % IJ SUSP
INTRAMUSCULAR | Status: AC
  Filled 2021-09-08: qty 20

## 2021-09-08 MED ORDER — BUPIVACAINE LIPOSOME 1.3 % IJ SUSP
INTRAMUSCULAR | Status: DC | PRN
  Administered 2021-09-08: 20 mL

## 2021-09-08 MED ORDER — ONDANSETRON HCL 4 MG/2ML IJ SOLN
INTRAMUSCULAR | Status: AC
  Filled 2021-09-08: qty 2

## 2021-09-08 MED ORDER — ROCURONIUM BROMIDE 100 MG/10ML IV SOLN
INTRAVENOUS | Status: DC | PRN
  Administered 2021-09-08: 60 mg via INTRAVENOUS

## 2021-09-08 MED ORDER — FENTANYL CITRATE (PF) 100 MCG/2ML IJ SOLN: 25.0000 ug | INTRAMUSCULAR | Status: DC | PRN

## 2021-09-08 MED ORDER — LACTATED RINGERS IV SOLN: INTRAVENOUS | Status: DC

## 2021-09-08 MED ORDER — OXYCODONE HCL 5 MG PO TABS: 5.0000 mg | ORAL_TABLET | Freq: Once | ORAL | Status: DC | PRN

## 2021-09-08 MED ORDER — ONDANSETRON 4 MG PO TBDP: 4.0000 mg | ORAL_TABLET | Freq: Three times a day (TID) | ORAL | 0 refills | Status: AC | PRN

## 2021-09-08 MED ORDER — SUGAMMADEX SODIUM 200 MG/2ML IV SOLN
INTRAVENOUS | Status: DC | PRN
  Administered 2021-09-08: 200 mg via INTRAVENOUS

## 2021-09-08 MED ORDER — MIDAZOLAM HCL 2 MG/2ML IJ SOLN
INTRAMUSCULAR | Status: AC
  Filled 2021-09-08: qty 2

## 2021-09-08 SURGICAL SUPPLY — 63 items
ADAPTER IRRIG TUBE 2 SPIKE SOL (ADAPTER) ×3 IMPLANT
ADPR TBG 2 SPK PMP STRL ASCP (ADAPTER) ×1
APL PRP STRL LF DISP 70% ISPRP (MISCELLANEOUS) ×1
BLADE SHAVER 4.5X7 STR FR (MISCELLANEOUS) ×2 IMPLANT
BUR BR 5.5 WIDE MOUTH (BURR) IMPLANT
CANNULA PART THRD DISP 5.75X7 (CANNULA) ×4 IMPLANT
CANNULA PARTIAL THREAD 2X7 (CANNULA) ×2 IMPLANT
CANNULA TWIST IN 8.25X9CM (CANNULA) IMPLANT
CHLORAPREP W/TINT 26 (MISCELLANEOUS) ×2 IMPLANT
COOLER POLAR GLACIER W/PUMP (MISCELLANEOUS) ×2 IMPLANT
DRAPE 3/4 80X56 (DRAPES) ×2 IMPLANT
DRAPE INCISE IOBAN 66X45 STRL (DRAPES) ×2 IMPLANT
DRAPE U-SHAPE 47X51 STRL (DRAPES) ×3 IMPLANT
DRSG TEGADERM 4X4.75 (GAUZE/BANDAGES/DRESSINGS) ×3 IMPLANT
ELECT REM PT RETURN 9FT ADLT (ELECTROSURGICAL)
ELECTRODE REM PT RTRN 9FT ADLT (ELECTROSURGICAL) IMPLANT
GAUZE 4X4 16PLY ~~LOC~~+RFID DBL (SPONGE) ×2 IMPLANT
GAUZE SPONGE 4X4 12PLY STRL (GAUZE/BANDAGES/DRESSINGS) ×2 IMPLANT
GAUZE XEROFORM 1X8 LF (GAUZE/BANDAGES/DRESSINGS) ×2 IMPLANT
GLOVE BIOGEL M 6.5 STRL (GLOVE) ×1 IMPLANT
GLOVE BIOGEL PI IND STRL 6.5 (GLOVE) IMPLANT
GLOVE BIOGEL PI IND STRL 8 (GLOVE) ×1 IMPLANT
GLOVE BIOGEL PI INDICATOR 6.5 (GLOVE) ×2
GLOVE BIOGEL PI INDICATOR 8 (GLOVE) ×1
GLOVE SURG ORTHO 8.0 STRL STRW (GLOVE) ×4 IMPLANT
GLOVE SURG SYN 8.0 (GLOVE) ×2 IMPLANT
GLOVE SURG SYN 8.0 PF PI (GLOVE) ×1 IMPLANT
GOWN STRL REUS W/ TWL LRG LVL3 (GOWN DISPOSABLE) ×1 IMPLANT
GOWN STRL REUS W/ TWL XL LVL3 (GOWN DISPOSABLE) ×1 IMPLANT
GOWN STRL REUS W/TWL LRG LVL3 (GOWN DISPOSABLE) ×2
GOWN STRL REUS W/TWL MED LVL3 (GOWN DISPOSABLE) ×1 IMPLANT
GOWN STRL REUS W/TWL XL LVL3 (GOWN DISPOSABLE) ×2
IV LACTATED RINGER IRRG 3000ML (IV SOLUTION) ×12
IV LR IRRIG 3000ML ARTHROMATIC (IV SOLUTION) ×20 IMPLANT
KIT STABILIZATION SHOULDER (MISCELLANEOUS) ×3 IMPLANT
KIT SUTURETAK 3.0 INSERT PERC (KITS) IMPLANT
KIT TURNOVER KIT A (KITS) ×2 IMPLANT
MANIFOLD NEPTUNE II (INSTRUMENTS) ×3 IMPLANT
MASK FACE SPIDER DISP (MASK) ×2 IMPLANT
MAT ABSORB  FLUID 56X50 GRAY (MISCELLANEOUS) ×2
MAT ABSORB FLUID 56X50 GRAY (MISCELLANEOUS) ×2 IMPLANT
PACK ARTHROSCOPY SHOULDER (MISCELLANEOUS) ×2 IMPLANT
PAD ABD DERMACEA PRESS 5X9 (GAUZE/BANDAGES/DRESSINGS) ×4 IMPLANT
PAD WRAPON POLAR SHDR XLG (MISCELLANEOUS) ×1 IMPLANT
PASSER SUT FIRSTPASS SELF (INSTRUMENTS) ×2 IMPLANT
SLING ULTRA II M (MISCELLANEOUS) IMPLANT
SPONGE T-LAP 18X18 ~~LOC~~+RFID (SPONGE) ×2 IMPLANT
SUT ETHILON 3-0 FS-10 30 BLK (SUTURE) ×2
SUT ETHILON 4-0 (SUTURE) ×2
SUT ETHILON 4-0 FS2 18XMFL BLK (SUTURE) ×1
SUT LASSO 90 DEG SD STR (SUTURE) IMPLANT
SUT MNCRL 4-0 (SUTURE)
SUT MNCRL 4-0 27XMFL (SUTURE)
SUTURE EHLN 3-0 FS-10 30 BLK (SUTURE) IMPLANT
SUTURE ETHLN 4-0 FS2 18XMF BLK (SUTURE) ×1 IMPLANT
SUTURE MNCRL 4-0 27XMF (SUTURE) IMPLANT
TAPE MICROFOAM 4IN (TAPE) ×2 IMPLANT
TUBING CONNECTING 10 (TUBING) ×2 IMPLANT
TUBING INFLOW SET DBFLO PUMP (TUBING) ×2 IMPLANT
TUBING OUTFLOW SET DBLFO PUMP (TUBING) ×2 IMPLANT
WAND WEREWOLF FLOW 90D (MISCELLANEOUS) ×1 IMPLANT
WATER STERILE IRR 500ML POUR (IV SOLUTION) ×2 IMPLANT
WRAPON POLAR PAD SHDR XLG (MISCELLANEOUS) ×2

## 2021-09-08 NOTE — Anesthesia Procedure Notes (Signed)
Anesthesia Regional Block: Interscalene brachial plexus block  ? ?Pre-Anesthetic Checklist: , timeout performed,  Correct Patient, Correct Site, Correct Laterality,  Correct Procedure,, risks and benefits discussed,  Surgical consent,  Pre-op evaluation,  At surgeon's request and post-op pain management ? ?Laterality: Left ? ?Prep: chloraprep     ?  ?Needles:  ?Injection technique: Single-shot ? ?Needle Type: Echogenic Needle   ? ? ? ? ? ? ? ?Additional Needles: ? ? ?Procedures:,,,, ultrasound used (permanent image in chart),,   ?Motor weakness within 20 minutes.  ?Narrative:  ?Start time: 09/08/2021 12:56 PM ?End time: 09/08/2021 12:58 PM ?Injection made incrementally with aspirations every 5 mL. ? ?Performed by: Personally  ?Anesthesiologist: Reed Breech, MD ? ?Additional Notes: ?Functioning IV was confirmed and monitors applied.  Sterile prep and drape, hand hygiene and sterile gloves were used. Ultrasound guidance: relevant anatomy identified, needle position confirmed, local anesthetic spread visualized around nerve(s), vascular puncture avoided.  Image saved to electronic medical record.  Negative aspiration prior to incremental administration of local anesthetic for total 20 ml Exparel and 10 ml bupivacaine 0.5% given in interscalene distribution. The patient tolerated the procedure well. Vital signs and moderate sedation medications recorded in RN notes.  ? ? ? ?

## 2021-09-08 NOTE — Transfer of Care (Signed)
Immediate Anesthesia Transfer of Care Note ? ?Patient: Tamara [name] ? ?Procedure(s) Performed: ARTHROSCOPY SHOULDER LYSIS OF ADHESION, CAPSULAR RELEASE (Left: Shoulder) ?CLOSED MANIPULATION SHOULDER WITH STEROID INJECTION (Left: Shoulder) ? ?Patient Location: PACU ? ?Anesthesia Type:General ? ?Level of Consciousness: awake ? ?Airway & Oxygen Therapy: Patient Spontanous Breathing ? ?Post-op Assessment: Report given to RN and Post -op Vital signs reviewed and unstable, Anesthesiologist notified ? ?Post vital signs: Reviewed and stable ? ?Last Vitals:  ?Vitals Value Taken Time  ?BP 135/83 09/08/21 1546  ?Temp 35.9   ?Pulse 73 09/08/21 1555  ?Resp 13 09/08/21 1555  ?SpO2 99 % 09/08/21 1555  ?Vitals shown include unvalidated device data. ? ?Last Pain:  ?Vitals:  ? 09/08/21 1137  ?TempSrc: Temporal  ?PainSc: 3   ?   ? ?  ? ?Complications: No notable events documented. ?

## 2021-09-08 NOTE — Anesthesia Procedure Notes (Signed)
Procedure Name: Intubation ?Date/Time: 09/08/2021 1:42 PM ?Performed by: Cammie Sickle, CRNA ?Pre-anesthesia Checklist: Patient identified, Patient being monitored, Timeout performed, Emergency Drugs available and Suction available ?Patient Re-evaluated:Patient Re-evaluated prior to induction ?Oxygen Delivery Method: Circle system utilized ?Preoxygenation: Pre-oxygenation with 100% oxygen ?Induction Type: IV induction ?Ventilation: Mask ventilation without difficulty ?Laryngoscope Size: 3 and McGraph ?Grade View: Grade I ?Tube type: Oral ?Tube size: 7.0 mm ?Number of attempts: 1 ?Airway Equipment and Method: Stylet ?Placement Confirmation: ETT inserted through vocal cords under direct vision, positive ETCO2 and breath sounds checked- equal and bilateral ?Secured at: 21 cm ?Tube secured with: Tape ?Dental Injury: Teeth and Oropharynx as per pre-operative assessment  ? ? ? ? ?

## 2021-09-08 NOTE — Anesthesia Preprocedure Evaluation (Addendum)
Anesthesia Evaluation  ?Patient identified by MRN, date of birth, ID band ?Patient awake ? ? ? ?Reviewed: ?Allergy & Precautions, NPO status , Patient's Chart, lab work & pertinent test results ? ?History of Anesthesia Complications ?(+) PONV and history of anesthetic complications ? ?Airway ?Mallampati: III ? ? ?Neck ROM: Full ? ? ? Dental ? ?(+) Missing, Chipped ?  ?Pulmonary ?neg pulmonary ROS,  ?  ?Pulmonary exam normal ?breath sounds clear to auscultation ? ? ? ? ? ? Cardiovascular ?Exercise Tolerance: Good ?Normal cardiovascular exam ?Rhythm:Regular Rate:Normal ? ?ECG 10/30/20:  ?NORMAL SINUS RHYTHM with PVCs in bigeminal pattern  ?INCOMPLETE RIGHT BUNDLE BRANCH BLOCK  ?CANNOT RULE OUT ANTERIOR INFARCT ?, AGE UNDETERMINED ?  ?Neuro/Psych ? Headaches,   ? GI/Hepatic ?GERD  ,NAFLD ?  ?Endo/Other  ?negative endocrine ROS ? Renal/GU ?negative Renal ROS  ? ?  ?Musculoskeletal ? ?(+) Arthritis ,  ? Abdominal ?  ?Peds ? Hematology ?negative hematology ROS ?(+)   ?Anesthesia Other Findings ? ? Reproductive/Obstetrics ? ?  ? ? ? ? ? ? ? ? ? ? ? ? ? ?  ?  ? ? ? ? ? ? ? ?Anesthesia Physical ?Anesthesia Plan ? ?ASA: 2 ? ?Anesthesia Plan: General and Regional  ? ?Post-op Pain Management: Regional block*  ? ?Induction: Intravenous ? ?PONV Risk Score and Plan: 4 or greater and Ondansetron, Dexamethasone, Treatment may vary due to age or medical condition, Scopolamine patch - Pre-op and TIVA ? ?Airway Management Planned: Oral ETT ? ?Additional Equipment:  ? ?Intra-op Plan:  ? ?Post-operative Plan: Extubation in OR ? ?Informed Consent: I have reviewed the patients History and Physical, chart, labs and discussed the procedure including the risks, benefits and alternatives for the proposed anesthesia with the patient or authorized representative who has indicated his/her understanding and acceptance.  ? ? ? ?Dental advisory given ? ?Plan Discussed with: CRNA ? ?Anesthesia Plan Comments: (Plan for  preoperative interscalene nerve block and GETA with TIVA for PONV ppx.  Patient consented for risks of anesthesia including but not limited to:  ?- adverse reactions to medications ?- damage to eyes, teeth, lips or other oral mucosa ?- nerve damage due to positioning  ?- sore throat or hoarseness ?- damage to heart, brain, nerves, lungs, other parts of body or loss of life ? ?Informed patient about role of CRNA in peri- and intra-operative care.  Patient voiced understanding.)  ? ? ? ? ? ?Anesthesia Quick Evaluation ? ?

## 2021-09-08 NOTE — H&P (Signed)
Paper H&P to be scanned into permanent record. H&P reviewed. No significant changes noted.  

## 2021-09-08 NOTE — Discharge Instructions (Addendum)
Post-Op Instructions - Shoulder Capsular Release/Manipulation Under Anesthesia ? ?1. Bracing: You should wear a sling for comfort only. Sling should NOT be worn longer than ~1 week.  ? ?2. Driving: No driving for 1 week post-op. Must be off narcotic pain medication. ? ?3. Activity: No active lifting for ~2 weeks. Perform range of motion exercises DAILY at home and with physical therapy as prescribed.  ? ?4. Physical Therapy: This NEEDS to begin the day after surgery, and proceed ~6-12 weeks. This should be at least 3x/week for the first 2 weeks.  ? ?5. Medications:  ?- You will be provided a prescription for narcotic pain medicine. After surgery, take 1-2 narcotic tablets every 4 hours if needed for severe pain.  ?- A prescription for anti-nausea medication will be provided in case the narcotic medicine causes nausea - take 1 tablet every 6 hours only if nauseated.   ?- Take tylenol 1000 mg (2 Extra Strength tablets or 3 regular strength) every 8 hours for pain.  May decrease or stop tylenol 5 days after surgery if you are having minimal pain. ?- Take ibuprofen 800mg  three times/day with food for at least two weeks every day. This will help reduce post-operative inflammation and swelling. Please call our offices if this causes any stomach/GI irritation.  ?- Take Aspirin 325mg /daily x 2 weeks to help prevent DVT/PE (Blood clots) ? ? ?If you are taking prescription medication for anxiety, depression, insomnia, muscle spasm, chronic pain, or for attention deficit disorder, you are advised that you are at a higher risk of adverse effects with use of narcotics post-op, including narcotic addiction/dependence, depressed breathing, death. ?If you use non-prescribed substances: alcohol, marijuana, cocaine, heroin, methamphetamines, etc., you are at a higher risk of adverse effects with use of narcotics post-op, including narcotic addiction/dependence, depressed breathing, death. ?You are advised that taking > 50 morphine  milligram equivalents (MME) of narcotic pain medication per day results in twice the risk of overdose or death. For your prescription provided: oxycodone 5 mg - taking more than 6 tablets per day would result in > 50 morphine milligram equivalents (MME) of narcotic pain medication. ?Be advised that we will prescribe narcotics short-term, for acute post-operative pain only - 3 weeks for major operations such as shoulder repair/reconstruction surgeries.  ? ? ?6. Post-Op Appointment: ? ?Your first post-op appointment will be ~1-2 weeks post-op. ? ?7. Work or School: For most, but not all procedures, we advise staying out of work or school for at least 1 to 2 weeks in order to recover from the stress of surgery and to allow time for healing.  ? ?If you need a work or school note this can be provided.  ? ? ? ?AMBULATORY SURGERY  ?DISCHARGE INSTRUCTIONS ? ? ?The drugs that you were given will stay in your system until tomorrow so for the next 24 hours you should not: ? ?Drive an automobile ?Make any legal decisions ?Drink any alcoholic beverage ? ? ?You may resume regular meals tomorrow.  Today it is better to start with liquids and gradually work up to solid foods. ? ?You may eat anything you prefer, but it is better to start with liquids, then soup and crackers, and gradually work up to solid foods. ? ? ?Please notify your doctor immediately if you have any unusual bleeding, trouble breathing, redness and pain at the surgery site, drainage, fever, or pain not relieved by medication. ? ? ? ?Additional Instructions: ? ? ? ? ? ? ? ?Please contact  your physician with any problems or Same Day Surgery at [phone], Monday through Friday 6 am to 4 pm, or [place] at [facility] number at [phone].  ? ? ? ? ?

## 2021-09-08 NOTE — Op Note (Signed)
OPERATIVE NOTE ?SURGERY DATE: 09/08/2021 ? ?PRE-OP DIAGNOSIS: ?1. Left shoulder adhesive capsulitis and synovitis ?2. Left subacromial bursitis ?3. Left shoulder adhesions ?  ?POST-OP DIAGNOSIS:  ?1. Left shoulder adhesive capsulitis and synovitis ?2. Left subacromial bursitis ?3. Left shoulder adhesions ? ?PROCEDURES: ?1. Left shoulder capsular releases with synovectomy, lysis of adhesions, manipulation under anesthesia  ?2. Left subacromial decompression without acromioplasty  ?3. Left glenohumeral and subacromial injections with corticosteroid ? ?SURGEON:  ?Cato Mulligan, MD ? ?ASSISTANT(S):  ?Cecille Amsterdam, PA-S ? ?ANESTHESIA: Regional block with Exparel, Gen ? ?TOTAL IV FLUIDS: per anesthesia record ? ? ESTIMATED BLOOD LOSS: Minimal ? ?DRAINS:  None. ? ?SPECIMENS: None ? ?IMPLANTS: None. ? ?COMPLICATIONS: none ? ?INDICATIONS: ?Tamara [name] is a 56 y.o. female who had undergone left shoulder arthroscopic rotator cuff repair using a Regeneten patch, arthroscopic biceps tenodesis, and debridement of the shoulder with subacromial decompression by me on 06/06/2021.  She had significant postoperative shoulder stiffness that failed extensive physical therapy and corticosteroid injections. Surgery was recommended for capsular releases, manipulation under anesthesia, and subacromial decompression/bursectomy with corticosteroid injections into glenohumeral joint and subacromial space. After discussion of risks, benefits, and alternatives to surgery, the patient elected to proceed.   ? ?OPERATIVE FINDINGS: ? ?Operative Shoulder Range of Motion: ? Preop  Postop  ?Flexion  90 150  ?Abduction  60 120  ?ER at 0  10 80  ?ER at 61  50 110  ?IR at 71  20 60  ?IR posterior  T12 T6  ? ? ?Intra-operative findings: A thorough arthroscopic examination of the shoulder was performed.  The findings are: ?1. Biceps tendon:  not visualized ?2. Superior labrum: injected with surrounding synovitis ?3. Posterior labrum and capsule:  normal ?4. Inferior capsule and inferior recess: Significant synovitis and thickening of capsule ?5. Glenoid cartilage surface: Scattered Grade 2-3 degenerative changes  ?6. Supraspinatus attachment: Normal with appropriate integration of prior Regeneten patch ?7. Posterior rotator cuff attachment:  Normal ?8. Humeral head articular cartilage: Scattered grade 1-2 degenerative changes ?9. Rotator interval: significant synovitis and thickening of capsule ?10: Subscapularis tendon:  Normal with significant surrounding adhesions ?11. Anterior labrum: Degenerative ?12. IGHL: significant synovitis around IGHL ? ? ?DETAILS OF PROCEDURE: ?The patient was identified in the preoperative holding area. Informed consent was obtained. Operative extremity was marked. After satisfactory upper extremity regional block with Exparel was performed in the preoperative holding area, the patient was brought to the operating room and placed in a well-padded beach chair positioner.  Eyes were protected, head was affixed in neutral, and the patient was given preoperative IV antibiotics within 30 minutes of the start of the case and a surgical time-out occurred. The upper extremity was prescrubbed with Hibiclens and alcohol, prepped with ChloraPrep and draped in the usual sterile fashion.   ? ?I then created a standard posterior portal with an 11 blade. The glenohumeral joint was easily entered with a blunt trochar and the arthroscope introduced. The findings of diagnostic arthroscopy are described above.  A standard anterior portal was made. ? ?The joint was remarkable for significant synovitis which was present in the anterior, superior, posterior, and inferior aspects. This required synovectomy with a shaver and Arthrocare device in the affected compartments listed above.  A combination of electrocautery and oscillating shaver was used to debride the rotator interval tissue.  The posterior aspect of the coracoid was exposed.  The anterior  and posterior aspects of the subscapularis were cleared of tissue so there was no tethering.  Next, an upbiting duckbill basket was then used to perform a capsulotomy of the rotator interval and then the MGHL and the IGHL (anterior band).  Care was taken to protect the intraarticular subscapularis.  Adhesions were cleared off the subscapularis to allow full internal and external rotation.   ? ?The arthroscope was then placed into the anterior portal.  The posterior capsule was quite thickened and inflamed as well.  After synovectomy, the duckbill basket was used to perform release from the superior glenoid, down into the axillary pouch, around to the anterior band of the IGHL.  A complete 360 degree capsulotomy was performed in this manner.  Care was taken to protect the axillary nerve by staying on the glenoid side and making sure not to rotate the shoulder externally during the capsulotomy.  Hemostasis was achieved with the ArthroCare wand.   ? ?The arthroscope was placed in the subacromial space. An accessory lateral portal was established. There was severe scar and chronic bursitis filling the whole subacromial space and gutters.  A complete subacromial bursectomy and debridement of the gutters was carried out with a shaver.  ArthroCare was used to control bleeding. Prior Regeneten patch was noted to be well-integrated into the supraspinatus. Fluid was evacuated from the shoulder and arthroscopic instruments were removed.  ? ?Manipulation under anesthesia was carried out in a gentle, controlled manner with short lever arms.  There was palpable release of adhesions. See above chart for post-manipulation improvement in range of motion.  ? ?The skin was closed with interrupted 3-0 nylon sutures. Injections of 40 mg Kenalog with 1% lidocaine and 0.5% ropivacaine were placed separately in the glenohumeral joint and subacromial space with a 22g needle. ? ?Xeroform gauze, sterile dressings were applied. The patient was  placed in a shoulder sling.  Polar Care was applied.   ? ?Instrument, sponge, and needle counts were correct prior to closure and at the conclusion of the case.  ? ?DISPOSITION: PACU - hemodynamically stable. ? ?POSTOPERATIVE PLAN: ?The patient will be discharged home. PT to begin 1 day postop for range of motion exercises.  ASA x 2 weeks for DVT ppx. Sling only for comfort and wean this week as soon as tolerated. RTC in ~1 week.  ? ? ? ?

## 2021-09-09 ENCOUNTER — Encounter: Payer: Self-pay | Admitting: Orthopedic Surgery

## 2021-09-09 NOTE — Anesthesia Postprocedure Evaluation (Signed)
Anesthesia Post Note ? ?Patient: Tamara [name] ? ?Procedure(s) Performed: ARTHROSCOPY SHOULDER LYSIS OF ADHESION, CAPSULAR RELEASE (Left: Shoulder) ?CLOSED MANIPULATION SHOULDER WITH STEROID INJECTION (Left: Shoulder) ? ?Patient location during evaluation: PACU ?Anesthesia Type: Regional ?Level of consciousness: awake and alert ?Pain management: pain level controlled ?Vital Signs Assessment: post-procedure vital signs reviewed and stable ?Respiratory status: spontaneous breathing, nonlabored ventilation, respiratory function stable and patient connected to nasal cannula oxygen ?Cardiovascular status: blood pressure returned to baseline and stable ?Postop Assessment: no apparent nausea or vomiting ?Anesthetic complications: no ? ? ?No notable events documented. ? ? ?Last Vitals:  ?Vitals:  ? 09/08/21 1615 09/08/21 1629  ?BP: 134/74 140/76  ?Pulse: 72 73  ?Resp: 19 16  ?Temp: (!) 36.3 ?C 36.6 ?C  ?SpO2: 99% 100%  ?  ?Last Pain:  ?Vitals:  ? 09/08/21 1629  ?TempSrc: Temporal  ?PainSc: 0-No pain  ? ? ?  ?  ?  ?  ?  ?  ? ?Yevette Edwards ? ? ? ? ?

## 2022-01-23 IMAGING — MR MR SHOULDER*L* W/O CM
5 of 6 series · 31 of 40 positions shown · non-contrast
Comparison: None.

CLINICAL DATA: Left shoulder pain since hyperextension injury 4
months ago. Patient felt a pop. No previous relevant surgery.

EXAM:
MRI OF THE LEFT SHOULDER WITHOUT CONTRAST
TECHNIQUE: Multiplanar, multisequence MR imaging of the shoulder was performed.
No intravenous contrast was administered.

[Series 5: T2 fat-sat · axial · left · 4.0mm · 0.44mm/px · z∈[-42,+72]mm · 8 of 26 slices shown (1 of 4)]
[im 1/26]
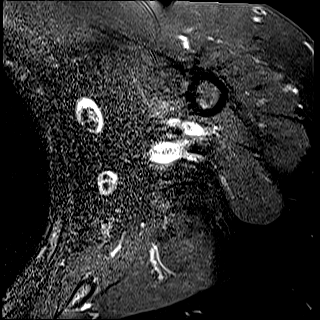
[im 4/26]
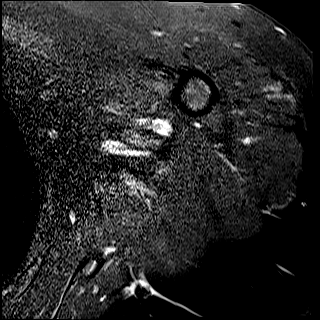
[im 8/26]
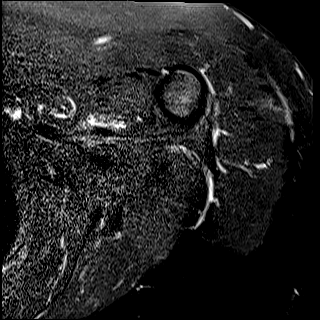
[im 11/26]
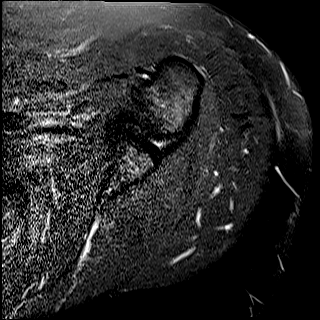
[im 15/26]
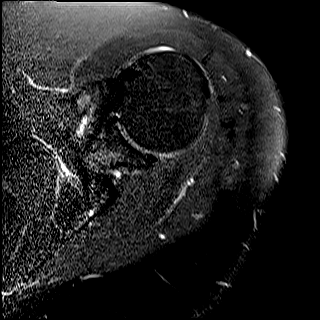
[im 18/26]
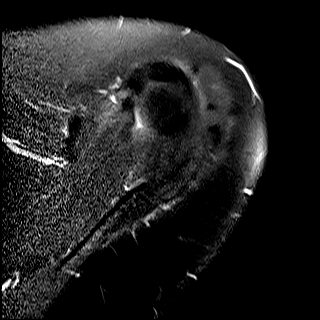
[im 22/26]
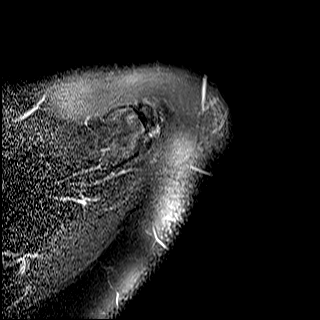
[im 26/26]
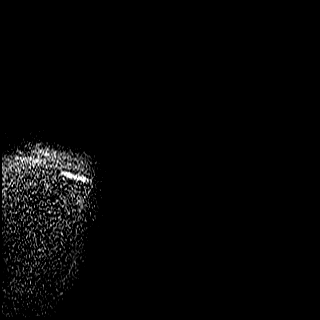

[Series 6: PD · oblique · left · 4.0mm · 0.44mm/px · 7 of 26 slices shown]
[im 1/26]
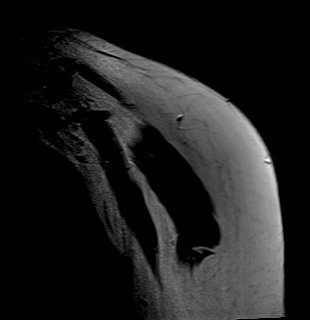
[im 5/26]
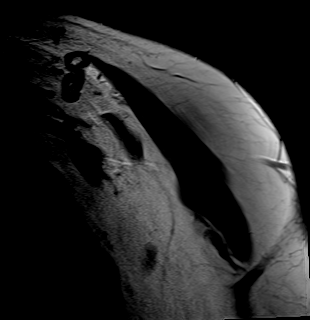
[im 9/26]
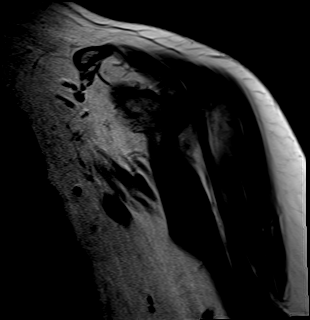
[im 13/26]
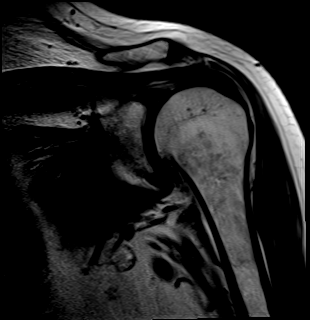
[im 17/26]
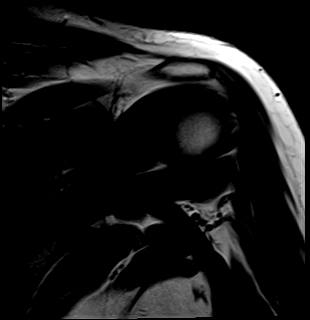
[im 21/26]
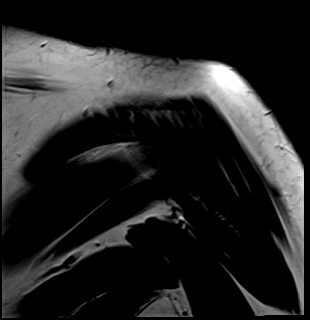
[im 26/26]
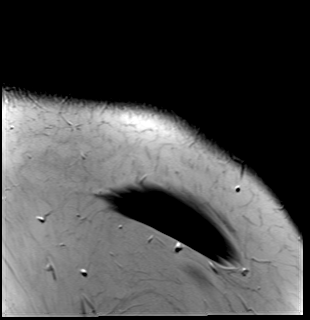

[Series 7: T2 fat-sat · oblique · left · 4.0mm · 0.44mm/px · 7 of 26 slices shown (2 of 4)]
[im 1/26]
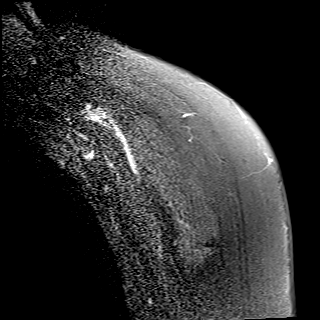
[im 5/26]
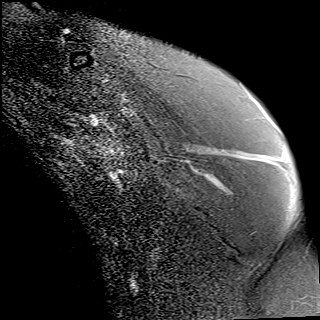
[im 9/26]
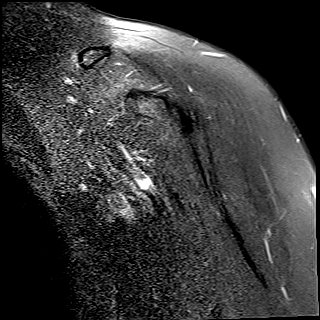
[im 13/26]
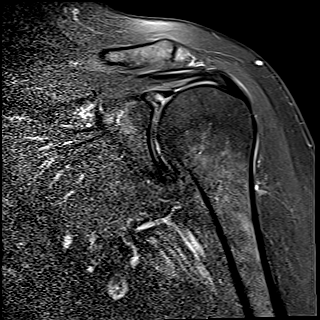
[im 17/26]
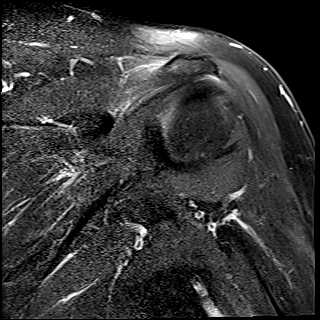
[im 21/26]
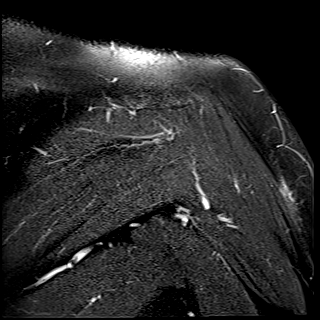
[im 26/26]
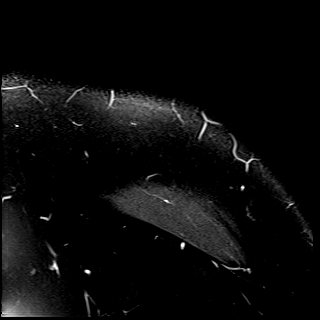

[Series 8: T2 fat-sat · oblique · left · 4.0mm · 0.22mm/px · 6 of 22 slices shown (3 of 4)]
[im 1/22]
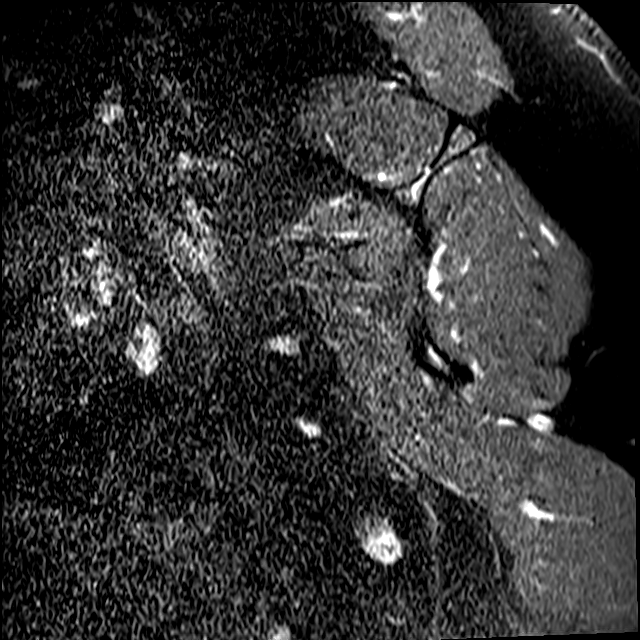
[im 5/22]
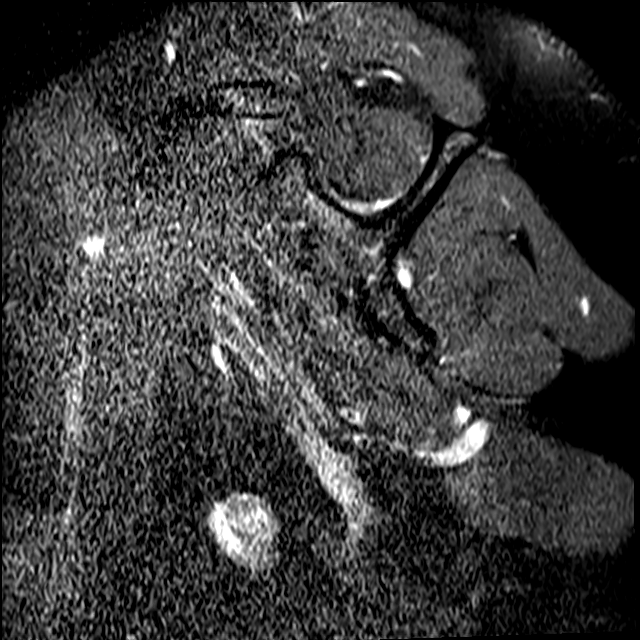
[im 9/22]
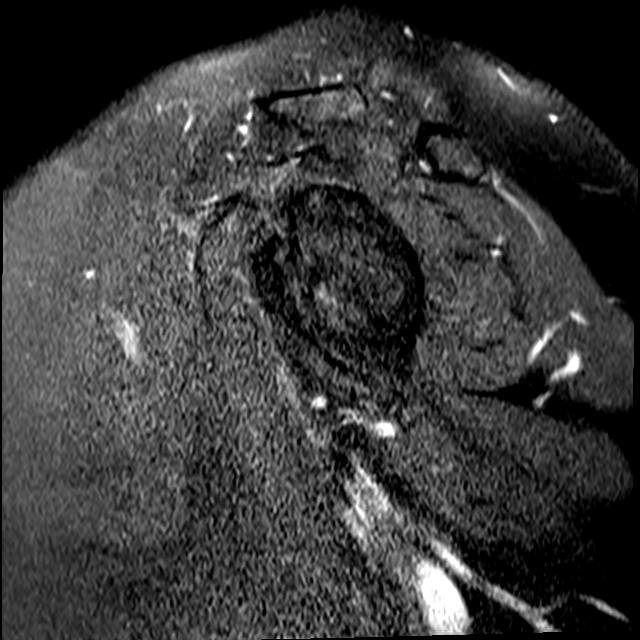
[im 13/22]
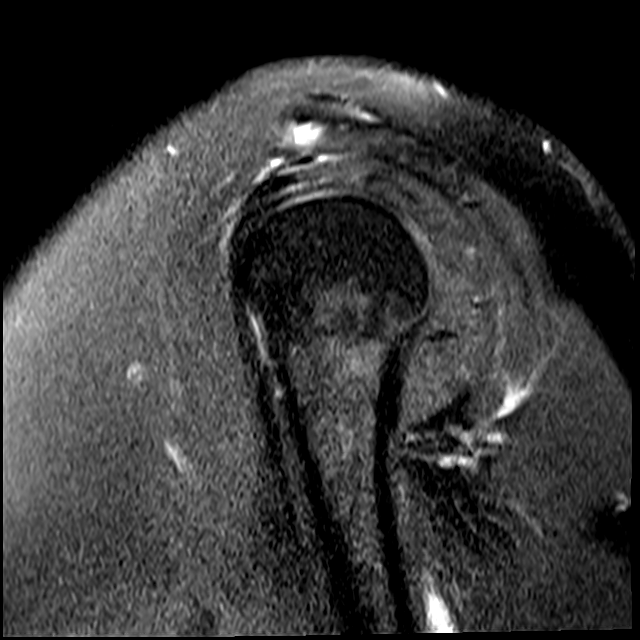
[im 17/22]
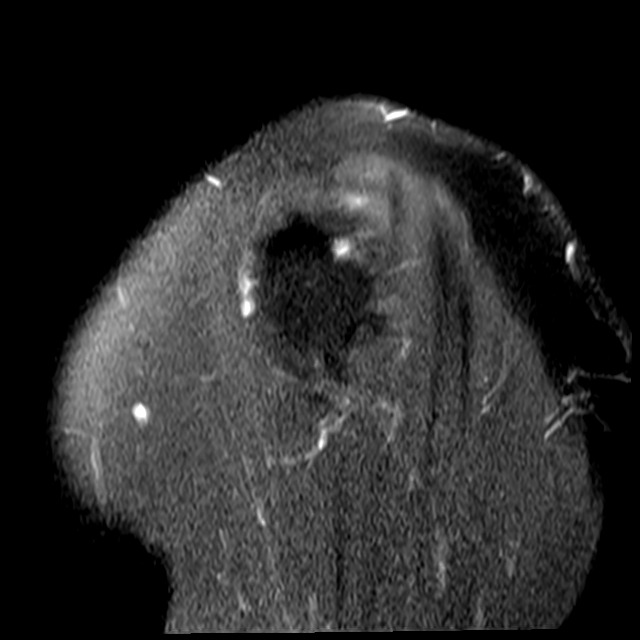
[im 22/22]
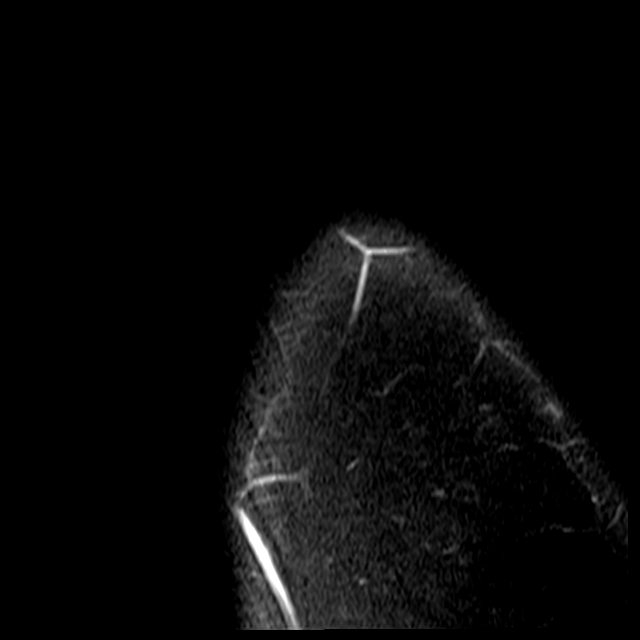

[Series 10: T2 fat-sat · oblique · left · 4.0mm · 0.55mm/px · 3 of 22 slices shown (4 of 4)]
[im 1/22]
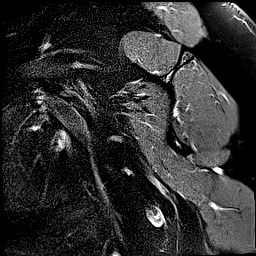
[im 5/22]
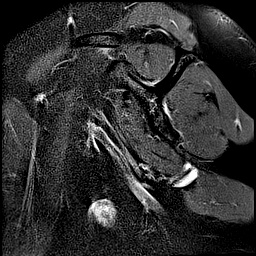
[im 9/22]
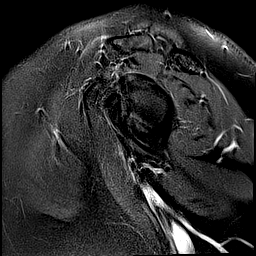

[31 of 40 positions shown; findings below may reference images not displayed]

FINDINGS: Rotator cuff: Mild supraspinatus tendinosis with tiny focus of
partial intrasubstance insertional tearing anteriorly, best seen on
the sagittal images. This involves less than 50% of the tendon
thickness, and there is no extension to the articular or bursal
surfaces. The infraspinatus, subscapularis and teres minor tendons
appear normal.

Muscles:  No focal muscular atrophy or edema.

Biceps long head:  Intact and normally positioned.

Acromioclavicular Joint: The acromion is type 2. There are minimal
acromioclavicular degenerative changes. Trace fluid anteriorly in
the subdeltoid bursa.

Glenohumeral Joint: No significant shoulder joint effusion or
glenohumeral arthropathy.

Labrum: Labral assessment is limited by the lack of joint fluid.
There is intrasubstance signal within the superior labrum without
posterior extension from the biceps attachment.

Bones: No acute or significant extra-articular osseous findings.Mild
subcortical cyst formation in the humeral head at the infraspinatus
insertion.

Other: No significant soft tissue findings.
IMPRESSION: 1. Supraspinatus tendinosis with small focus of partial
intrasubstance insertional tearing.
2. The additional components of the rotator cuff and biceps tendon
appear normal.
3. Signal in the superior labrum does not appear to extend posterior
to the biceps anchor and may be degenerative or a normal variant. MR
arthrography could be performed if clinical concern of labral tear.
